# Patient Record
Sex: Male | Born: 1985 | Race: White | Hispanic: No | Marital: Married | Smoking: Never smoker
Health system: Southern US, Community
[De-identification: ages and names within clinical notes are randomized; demographics above are authoritative.]

## PROBLEM LIST (undated history)

## (undated) DIAGNOSIS — T7840XA Allergy, unspecified, initial encounter: Secondary | ICD-10-CM

## (undated) DIAGNOSIS — I10 Essential (primary) hypertension: Secondary | ICD-10-CM

## (undated) DIAGNOSIS — K219 Gastro-esophageal reflux disease without esophagitis: Secondary | ICD-10-CM

## (undated) HISTORY — DX: Allergy, unspecified, initial encounter: T78.40XA

## (undated) HISTORY — DX: Essential (primary) hypertension: I10

## (undated) HISTORY — PX: INNER EAR SURGERY: SHX679

## (undated) HISTORY — PX: EYE SURGERY: SHX253

## (undated) HISTORY — DX: Gastro-esophageal reflux disease without esophagitis: K21.9

---

## 2014-04-22 ENCOUNTER — Emergency Department: Payer: Self-pay | Admitting: Emergency Medicine

## 2015-06-19 ENCOUNTER — Encounter: Payer: Self-pay | Admitting: Emergency Medicine

## 2015-06-19 ENCOUNTER — Emergency Department
Admission: EM | Admit: 2015-06-19 | Discharge: 2015-06-20 | Disposition: A | Discharge status: Home / Self Care | Payer: BLUE CROSS/BLUE SHIELD | Attending: Emergency Medicine | Admitting: Emergency Medicine

## 2015-06-19 ENCOUNTER — Emergency Department: Payer: BLUE CROSS/BLUE SHIELD

## 2015-06-19 DIAGNOSIS — S29019A Strain of muscle and tendon of unspecified wall of thorax, initial encounter: Secondary | ICD-10-CM

## 2015-06-19 DIAGNOSIS — Y92410 Unspecified street and highway as the place of occurrence of the external cause: Secondary | ICD-10-CM | POA: Insufficient documentation

## 2015-06-19 DIAGNOSIS — S299XXA Unspecified injury of thorax, initial encounter: Secondary | ICD-10-CM | POA: Diagnosis present

## 2015-06-19 DIAGNOSIS — Y998 Other external cause status: Secondary | ICD-10-CM | POA: Insufficient documentation

## 2015-06-19 DIAGNOSIS — S29012A Strain of muscle and tendon of back wall of thorax, initial encounter: Secondary | ICD-10-CM | POA: Diagnosis not present

## 2015-06-19 DIAGNOSIS — Y9389 Activity, other specified: Secondary | ICD-10-CM | POA: Diagnosis not present

## 2015-06-19 MED ORDER — NAPROXEN 500 MG PO TABS: 500.0000 mg | ORAL_TABLET | Freq: Two times a day (BID) | ORAL | Status: DC

## 2015-06-19 NOTE — Discharge Instructions (Signed)
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
## 2015-06-20 MED ORDER — CYCLOBENZAPRINE HCL 5 MG PO TABS: 5.0000 mg | ORAL_TABLET | Freq: Three times a day (TID) | ORAL | Status: DC | PRN
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
## 2019-08-20 ENCOUNTER — Emergency Department: Payer: BC Managed Care – PPO

## 2019-08-20 ENCOUNTER — Encounter: Payer: Self-pay | Admitting: Emergency Medicine

## 2019-08-20 ENCOUNTER — Emergency Department
Admission: EM | Admit: 2019-08-20 | Discharge: 2019-08-20 | Disposition: A | Discharge status: Home / Self Care | Payer: BC Managed Care – PPO | Attending: Emergency Medicine | Admitting: Emergency Medicine

## 2019-08-20 DIAGNOSIS — M25561 Pain in right knee: Secondary | ICD-10-CM | POA: Insufficient documentation

## 2019-08-20 MED ORDER — MELOXICAM 15 MG PO TABS: 15.0000 mg | ORAL_TABLET | Freq: Every day | ORAL | 1 refills | Status: AC

## 2019-08-20 NOTE — ED Triage Notes (Signed)
Pt reports he was seen at [facility] on 11/10, no xrays, placed in ace wrap and pt worked today and pain and swelling to the right knee started to go down into right foot. Minimal swelling noted to area. Pt denies injury.

## 2019-08-20 NOTE — ED Notes (Signed)
Pt presents with c/o right knee pain that radiates to right foot. Pt ambulatory to ED room in NAD. Pt with steady gait.

## 2019-08-20 NOTE — ED Provider Notes (Signed)
[facility] Emergency Department Provider Note  ____________________________________________  Time seen: Approximately 10:05 PM  I have reviewed the triage vital signs and the nursing notes.   HISTORY  Chief Complaint Knee Pain    HPI Dennis Gibson is a 33 y.o. male presents to the emergency department with acute right knee pain for the past 2 days.  Patient states that he was seen and evaluated at a local urgent care on 11/10 and was treated conservatively with ibuprofen.  Patient states that he has been diagnosed with psoriasis in the past but has never been diagnosed with psoriatic arthritis.  He denies falls or mechanisms of trauma.  He has been able to ambulate without difficulty.        No past medical history on file.  There are no active problems to display for this patient.   No past surgical history on file.  Prior to Admission medications   Medication Sig Start Date End Date Taking? Authorizing Provider  cyclobenzaprine (FLEXERIL) 5 MG tablet Take 1 tablet (5 mg total) by mouth every 8 (eight) hours as needed for muscle spasms. 06/20/15   Paulette Blanch, MD  meloxicam (MOBIC) 15 MG tablet Take 1 tablet (15 mg total) by mouth daily for 7 days. 08/20/19 08/27/19  Lannie Fields, PA-C  naproxen (NAPROSYN) 500 MG tablet Take 1 tablet (500 mg total) by mouth 2 (two) times daily with a meal. 06/19/15   Carrie Mew, MD    Allergies Patient has no known allergies.  No family history on file.  Social History Social History   Tobacco Use  . Smoking status: Never Smoker  . Smokeless tobacco: Never Used  Substance Use Topics  . Alcohol use: Not on file  . Drug use: Not on file     Review of Systems  Constitutional: No fever/chills Eyes: No visual changes. No discharge ENT: No upper respiratory complaints. Cardiovascular: no chest pain. Respiratory: no cough. No SOB. Gastrointestinal: No abdominal pain.  No nausea, no vomiting.  No  diarrhea.  No constipation. Musculoskeletal: Patient has right knee pain.  Skin: Patient has rash.  Neurological: Negative for headaches, focal weakness or numbness.   ____________________________________________   PHYSICAL EXAM:  VITAL SIGNS: ED Triage Vitals  Enc Vitals Group     BP 08/20/19 2008 (!) 139/95     Pulse Rate 08/20/19 2008 79     Resp 08/20/19 2008 18     Temp 08/20/19 2008 98.3 F (36.8 C)     Temp Source 08/20/19 2008 Oral     SpO2 08/20/19 2008 97 %     Weight --      Height --      Head Circumference --      Peak Flow --      Pain Score 08/20/19 2200 0     Pain Loc --      Pain Edu? --      Excl. in [place]? --      Constitutional: Alert and oriented. Well appearing and in no acute distress. Eyes: Conjunctivae are normal. PERRL. EOMI. Head: Atraumatic. ENT:      Nose: No congestion/rhinnorhea.      Mouth/Throat: Mucous membranes are moist.  Neck: No stridor.  No cervical spine tenderness to palpation. Cardiovascular: Normal rate, regular rhythm. Normal S1 and S2.  Good peripheral circulation. Respiratory: Normal respiratory effort without tachypnea or retractions. Lungs CTAB. Good air entry to the bases with no decreased or absent breath sounds. Gastrointestinal:  Bowel sounds 4 quadrants. Soft and nontender to palpation. No guarding or rigidity. No palpable masses. No distention. No CVA tenderness. Musculoskeletal: No significant effusion of the right knee.  Negative anterior and posterior drawer test.  Negative ballottement.  No laxity with MCL or LCL testing.  Palpable dorsalis pedis pulse, right. Neurologic:  Normal speech and language. No gross focal neurologic deficits are appreciated.  Skin: Patient has psoriasis of the knees and elbows. Psychiatric: Mood and affect are normal. Speech and behavior are normal. Patient exhibits appropriate insight and judgement.   ____________________________________________   LABS (all labs ordered are listed,  but only abnormal results are displayed)  Labs Reviewed - No data to display ____________________________________________  EKG   ____________________________________________  RADIOLOGY I personally viewed and evaluated these images as part of my medical decision making, as well as reviewing the written report by the radiologist.  Dg Knee 2 Views Right  Result Date: 08/20/2019 CLINICAL DATA:  Right knee pain EXAM: RIGHT KNEE - 1-2 VIEW COMPARISON:  None. FINDINGS: No evidence of fracture, or dislocation. There is a small knee joint effusion. Tiny well corticated ossicle seen within the lateral joint space which is nonspecific. No evidence of arthropathy or other focal bone abnormality. Soft tissues are unremarkable. IMPRESSION: No acute osseous abnormality. 1. Small knee joint effusion. Electronically Signed   By: Jonna Clark M.D.   On: 08/20/2019 21:19    ____________________________________________    PROCEDURES  Procedure(s) performed:    Procedures    Medications - No data to display   ____________________________________________   INITIAL IMPRESSION / ASSESSMENT AND PLAN / ED COURSE  Pertinent labs & imaging results that were available during my care of the patient were reviewed by me and considered in my medical decision making (see chart for details).  Review of the [place] CSRS was performed in accordance of the [facility] prior to dispensing any controlled drugs.           Assessment and plan Psoriatic arthritis 33 year old male presents to the emergency department with acute right knee pain for the past 2 days.  Patient was hypertensive at triage but vital signs were otherwise reassuring.  Patient had evidence of psoriasis over her right knee and right and left elbow.  I am suspicious for psoriatic arthritis.  No bony abnormality was visualized on x-ray examination of the right knee.  There was no significant edema of the right knee or right calf.  No overlying  erythema.  Patient was discharged with meloxicam and advised to follow-up with primary care as needed.  All patient questions were answered.   ____________________________________________  FINAL CLINICAL IMPRESSION(S) / ED DIAGNOSES  Final diagnoses:  Acute pain of right knee      NEW MEDICATIONS STARTED DURING THIS VISIT:  ED Discharge Orders         Ordered    meloxicam (MOBIC) 15 MG tablet  Daily     08/20/19 2152              This chart was dictated using voice recognition software/Dragon. Despite best efforts to proofread, errors can occur which can change the meaning. Any change was purely unintentional.    Orvil Feil, PA-C 08/20/19 2215    Minna Antis, MD 08/20/19 2308

## 2020-06-22 ENCOUNTER — Ambulatory Visit: Payer: Self-pay | Admitting: Family Medicine

## 2021-09-18 ENCOUNTER — Ambulatory Visit
Admission: EM | Admit: 2021-09-18 | Discharge: 2021-09-18 | Disposition: A | Discharge status: Home / Self Care | Payer: No Typology Code available for payment source | Attending: Physician Assistant | Admitting: Physician Assistant

## 2021-09-18 ENCOUNTER — Other Ambulatory Visit: Payer: Self-pay

## 2021-09-18 ENCOUNTER — Encounter: Payer: Self-pay | Admitting: Emergency Medicine

## 2021-09-18 DIAGNOSIS — Z20822 Contact with and (suspected) exposure to covid-19: Secondary | ICD-10-CM | POA: Insufficient documentation

## 2021-09-18 DIAGNOSIS — R051 Acute cough: Secondary | ICD-10-CM | POA: Diagnosis not present

## 2021-09-18 DIAGNOSIS — H9203 Otalgia, bilateral: Secondary | ICD-10-CM | POA: Insufficient documentation

## 2021-09-18 DIAGNOSIS — J029 Acute pharyngitis, unspecified: Secondary | ICD-10-CM | POA: Diagnosis not present

## 2021-09-18 DIAGNOSIS — J069 Acute upper respiratory infection, unspecified: Secondary | ICD-10-CM | POA: Diagnosis not present

## 2021-09-18 LAB — GROUP A STREP BY PCR: Group A Strep by PCR: NOT DETECTED

## 2021-09-18 LAB — RESP PANEL BY RT-PCR (FLU A&B, COVID) ARPGX2
Influenza A by PCR: NEGATIVE
Influenza B by PCR: NEGATIVE
SARS Coronavirus 2 by RT PCR: NEGATIVE

## 2021-09-18 MED ORDER — LIDOCAINE VISCOUS HCL 2 % MT SOLN: 15.0000 mL | OROMUCOSAL | 0 refills | Status: DC | PRN

## 2021-09-18 MED ORDER — PSEUDOEPH-BROMPHEN-DM 30-2-10 MG/5ML PO SYRP: 10.0000 mL | ORAL_SOLUTION | Freq: Four times a day (QID) | ORAL | 0 refills | Status: AC | PRN

## 2021-09-18 NOTE — Discharge Instructions (Addendum)
-  Negative strep, COVID and flu. - I have sent medication to help with your symptoms. - Use over-the-counter Debrox solution to help soften your earwax.  Perhaps removing some of the earwax may help with your ear pain.  URI/COLD SYMPTOMS: Your exam today is consistent with a viral illness. Antibiotics are not indicated at this time. Use medications as directed, including cough syrup, nasal saline, and decongestants. Your symptoms should improve over the next few days and resolve within 7-10 days. Increase rest and fluids. F/u if symptoms worsen or predominate such as sore throat, ear pain, productive cough, shortness of breath, or if you develop high fevers or worsening fatigue over the next several days.

## 2021-09-18 NOTE — ED Provider Notes (Signed)
[facility]    CSN: [id number] Arrival date & time: 09/18/21  0801      History   Chief Complaint Chief Complaint  Patient presents with   Sore Throat   Otalgia   Cough    HPI Dennis Gibson is a 35 y.o. male presenting for 2-day history of cough, congestion, sore throat and bilateral ear pain.  Patient reports that his throat hurts the most and he believes he may have strep throat or possibly ear infection.  He has not recorded any fevers or had chills or sweats.  Denies body aches, chest pain, breathing difficulty, vomiting or diarrhea.  Has been taking over-the-counter decongestants.  No sick contacts or known exposure to COVID or flu.  No other complaints.  HPI  History reviewed. No pertinent past medical history.  There are no problems to display for this patient.   Past Surgical History:  Procedure Laterality Date   EYE SURGERY     INNER EAR SURGERY Right        Home Medications    Prior to Admission medications   Medication Sig Start Date End Date Taking? Authorizing Provider  brompheniramine-pseudoephedrine-DM 30-2-10 MG/5ML syrup Take 10 mLs by mouth 4 (four) times daily as needed for up to 7 days. 09/18/21 09/25/21 Yes Shirlee Latch, PA-C  famotidine (PEPCID) 20 MG tablet Take 20 mg by mouth 2 (two) times daily.   Yes [provider]  lidocaine (XYLOCAINE) 2 % solution Use as directed 15 mLs in the mouth or throat every 3 (three) hours as needed for mouth pain (swish and spit). 09/18/21  Yes Shirlee Latch PA-C    Family History History reviewed. No pertinent family history.  Social History Social History   Tobacco Use   Smoking status: Never   Smokeless tobacco: Never  Vaping Use   Vaping Use: Never used  Substance Use Topics   Alcohol use: Never     Allergies   Patient has no known allergies.   Review of Systems Review of Systems  Constitutional:  Negative for fatigue and fever.  HENT:  Positive for congestion,  ear pain, rhinorrhea and sore throat. Negative for sinus pressure and sinus pain.   Respiratory:  Positive for cough. Negative for shortness of breath.   Gastrointestinal:  Negative for abdominal pain, diarrhea, nausea and vomiting.  Musculoskeletal:  Negative for myalgias.  Neurological:  Negative for weakness, light-headedness and headaches.  Hematological:  Negative for adenopathy.    Physical Exam Triage Vital Signs ED Triage Vitals  Enc Vitals Group     BP      Pulse      Resp      Temp      Temp src      SpO2      Weight      Height      Head Circumference      Peak Flow      Pain Score      Pain Loc      Pain Edu?      Excl. in GC?    No data found.  Updated Vital Signs BP (!) 159/110 (BP Location: Left Arm)   Pulse 98   Temp 98.8 F (37.1 C) (Oral)   Resp 16   Ht 5\' 8"  (1.727 m)   Wt 215 lb (97.5 kg)   SpO2 97%   BMI 32.69 kg/m      Physical Exam Vitals and nursing note reviewed.  Constitutional:  General: He is not in acute distress.    Appearance: Normal appearance. He is well-developed. He is ill-appearing.  HENT:     Head: Normocephalic and atraumatic.     Right Ear: Tympanic membrane normal.     Left Ear: Tympanic membrane normal.     Ears:     Comments: Moderate amount of cerumen obscuring about 50% of TMs bilaterally    Nose: Congestion present.     Mouth/Throat:     Mouth: Mucous membranes are moist.     Pharynx: Oropharynx is clear. Posterior oropharyngeal erythema present.  Eyes:     General: No scleral icterus.    Conjunctiva/sclera: Conjunctivae normal.  Cardiovascular:     Rate and Rhythm: Normal rate and regular rhythm.     Heart sounds: Normal heart sounds.  Pulmonary:     Effort: Pulmonary effort is normal. No respiratory distress.     Breath sounds: Normal breath sounds.  Musculoskeletal:     Cervical back: Neck supple.  Skin:    General: Skin is warm and dry.     Capillary Refill: Capillary refill takes less than 2  seconds.  Neurological:     General: No focal deficit present.     Mental Status: He is alert. Mental status is at baseline.     Motor: No weakness.     Coordination: Coordination normal.     Gait: Gait normal.  Psychiatric:        Mood and Affect: Mood normal.        Behavior: Behavior normal.        Thought Content: Thought content normal.     UC Treatments / Results  Labs (all labs ordered are listed, but only abnormal results are displayed) Labs Reviewed  GROUP A STREP BY PCR  RESP PANEL BY RT-PCR (FLU A&B, COVID) [id number]    EKG   Radiology No results found.  Procedures Procedures (including critical care time)  Medications Ordered in UC Medications - No data to display  Initial Impression / Assessment and Plan / UC Course  I have reviewed the triage vital signs and the nursing notes.  Pertinent labs & imaging results that were available during my care of the patient were reviewed by me and considered in my medical decision making (see chart for details).  35 year old male presenting for 2-day history of cough, congestion and sore throat.  Also reports that his ears hurt.  BP elevated 159/110.  He is afebrile.  He is mildly ill-appearing, appears fatigued.  Nontoxic.  On exam he has nasal congestion and mild posterior pharyngeal erythema.  He does have cerumen of bilateral EACs obstructing about 50% of TMs but the TM portion I can see are neutral, nonerythematous and nonbulging.  Chest is clear to auscultation.  PCR strep obtained and negative. Respiratory panel obtained and negative.  Reviewed results of labs with patient.  Advised him that he has another viral illness.  Suspect he will feel unwell for about 7 to 10 days.  Advised supportive care and symptomatic treatment.  Treating with Bromfed-DM and viscous lidocaine.  Advised to increase rest and fluids.  Work note given for the next couple of days.  Reviewed return and ER precautions.  Final Clinical  Impressions(s) / UC Diagnoses   Final diagnoses:  Viral upper respiratory tract infection  Acute cough  Sore throat  Otalgia of both ears     Discharge Instructions      -Negative strep, COVID and flu. - I have  sent medication to help with your symptoms. - Use over-the-counter Debrox solution to help soften your earwax.  Perhaps removing some of the earwax may help with your ear pain.  URI/COLD SYMPTOMS: Your exam today is consistent with a viral illness. Antibiotics are not indicated at this time. Use medications as directed, including cough syrup, nasal saline, and decongestants. Your symptoms should improve over the next few days and resolve within 7-10 days. Increase rest and fluids. F/u if symptoms worsen or predominate such as sore throat, ear pain, productive cough, shortness of breath, or if you develop high fevers or worsening fatigue over the next several days.       ED Prescriptions     Medication Sig Dispense Auth. Provider   brompheniramine-pseudoephedrine-DM 30-2-10 MG/5ML syrup Take 10 mLs by mouth 4 (four) times daily as needed for up to 7 days. 150 mL Eusebio Friendly B, PA-C   lidocaine (XYLOCAINE) 2 % solution Use as directed 15 mLs in the mouth or throat every 3 (three) hours as needed for mouth pain (swish and spit). 100 mL Shirlee Latch, PA-C      PDMP not reviewed this encounter.   Shirlee Latch, PA-C 09/18/21 [phone]

## 2021-09-18 NOTE — ED Triage Notes (Signed)
Patient c/o cough, sore throat, bilateral ear pain that started on Friday.  Patient unsure of fevers.

## 2022-06-20 ENCOUNTER — Emergency Department
Admission: EM | Admit: 2022-06-20 | Discharge: 2022-06-20 | Disposition: A | Discharge status: Home / Self Care | Payer: No Typology Code available for payment source | Attending: Emergency Medicine | Admitting: Emergency Medicine

## 2022-06-20 ENCOUNTER — Other Ambulatory Visit: Payer: Self-pay

## 2022-06-20 ENCOUNTER — Emergency Department: Payer: No Typology Code available for payment source

## 2022-06-20 ENCOUNTER — Encounter: Payer: Self-pay | Admitting: Emergency Medicine

## 2022-06-20 DIAGNOSIS — R030 Elevated blood-pressure reading, without diagnosis of hypertension: Secondary | ICD-10-CM | POA: Diagnosis not present

## 2022-06-20 DIAGNOSIS — M545 Low back pain, unspecified: Secondary | ICD-10-CM | POA: Diagnosis present

## 2022-06-20 MED ORDER — METHOCARBAMOL 500 MG PO TABS
1000.0000 mg | ORAL_TABLET | Freq: Once | ORAL | Status: AC
  Administered 2022-06-20: 1000 mg via ORAL
  Filled 2022-06-20: qty 2

## 2022-06-20 MED ORDER — NAPROXEN 500 MG PO TABS
500.0000 mg | ORAL_TABLET | Freq: Two times a day (BID) | ORAL | 0 refills | Status: DC
  Filled 2022-06-20: qty 20, 10d supply, fill #0

## 2022-06-20 MED ORDER — METHOCARBAMOL 500 MG PO TABS
ORAL_TABLET | ORAL | 0 refills | Status: DC
  Filled 2022-06-20: qty 20, 3d supply, fill #0

## 2022-06-20 MED ORDER — OXYCODONE-ACETAMINOPHEN 7.5-325 MG PO TABS
1.0000 | ORAL_TABLET | Freq: Once | ORAL | Status: DC
  Filled 2022-06-20: qty 1

## 2022-06-20 MED ORDER — KETOROLAC TROMETHAMINE 30 MG/ML IJ SOLN
30.0000 mg | Freq: Once | INTRAMUSCULAR | Status: AC
  Administered 2022-06-20: 30 mg via INTRAMUSCULAR
  Filled 2022-06-20: qty 1

## 2022-06-20 NOTE — Discharge Instructions (Addendum)
Follow-up with [place] urgent care or doctor of your choice if any continued problems.  Begin taking methocarbamol 1 or 2 tablets every 6 hours as needed for muscle spasms and naproxen 500 mg twice a day with food.  You may also use ice or heat to your back as needed for discomfort.  The over-the-counter Lidoderm patches may also give you some relief.  You should plan on having your blood pressure rechecked after your back is better as your blood pressure may be elevated just due to pain however in the emergency department your blood pressure was elevated initial blood pressure was 157/104 and the second time it was checked was 157/114.

## 2022-06-20 NOTE — ED Notes (Signed)
36 yom with a c/c of lower back pain since Saturday. The pt denies any injury or previous back problems.

## 2022-06-20 NOTE — ED Provider Notes (Signed)
[facility] Provider Note    Event Date/Time   First MD Initiated Contact with Patient 06/20/22 1032     (approximate)   History   Back Pain   HPI  Dennis Gibson is a 36 y.o. male   to the ED with complaint of left lower back pain that started Saturday when he woke up.  Patient does not recall any specific injury.  He states that lying flat puts too much pressure on his back and he is unable to tolerate that.  He has been sleeping in a recliner to relieve some of the pain.  He has been using topical creams to the area with some minimal relief.  He denies any urinary symptoms, incontinence of bowel or bladder.  Patient continues to be ambulatory.  No prior history of back issues.      Physical Exam   Triage Vital Signs: ED Triage Vitals  Enc Vitals Group     BP 06/20/22 1009 (!) 157/104     Pulse Rate 06/20/22 1009 85     Resp 06/20/22 1009 18     Temp 06/20/22 1009 98.3 F (36.8 C)     Temp Source 06/20/22 1009 Oral     SpO2 06/20/22 1009 95 %     Weight 06/20/22 1010 214 lb 15.2 oz (97.5 kg)     Height 06/20/22 1010 5\' 8"  (1.727 m)     Head Circumference --      Peak Flow --      Pain Score 06/20/22 1009 6     Pain Loc --      Pain Edu? --      Excl. in GC? --     Most recent vital signs: Vitals:   06/20/22 1220 06/20/22 1235  BP: (!) 157/114 (!) 160/100  Pulse:  87  Resp:  16  Temp:    SpO2:  99%     General: Awake, no distress.  CV:  Good peripheral perfusion.  Heart regular rate and rhythm. Resp:  Normal effort.  Lungs are clear bilaterally. Abd:  No distention.  Other:  No point bony tenderness on palpation of the thoracic or lumbar spine.  There is tenderness on palpation of the left paravertebral muscles and range of motion is markedly guarded secondary to increased pain.   ED Results / Procedures / Treatments   Labs (all labs ordered are listed, but only abnormal results are displayed) Labs Reviewed - No data to  display   RADIOLOGY Spine x-ray images were reviewed by myself independent of the radiologist and no acute fractures noted.  Radiology report officially reads negative.    PROCEDURES:  Critical Care performed:   Procedures   MEDICATIONS ORDERED IN ED: Medications  oxyCODONE-acetaminophen (PERCOCET) 7.5-325 MG per tablet 1 tablet (1 tablet Oral Not Given 06/20/22 1105)  methocarbamol (ROBAXIN) tablet 1,000 mg (1,000 mg Oral Given 06/20/22 1105)  ketorolac (TORADOL) 30 MG/ML injection 30 mg (30 mg Intramuscular Given 06/20/22 1105)     IMPRESSION / MDM / ASSESSMENT AND PLAN / ED COURSE  I reviewed the triage vital signs and the nursing notes.   Differential diagnosis includes, but is not limited to, low back pain, low back strain, degenerative disc disease, cauda equina.  36 year old male presents to the ED with complaint of low back pain without history of injury.  Patient states that he has been unable to move easily without severe pain has been sleeping in a recliner due to inability  to lie flat.  Patient denies any radiation to his lower extremities.  X-rays are reassuring and patient was made aware.  Prior to discharge patient was feeling much better after being given Toradol 30 mg IM and methocarbamol 1000 mg p.o.  Patient is to continue with warm moist compresses or ice to his back and a prescription for methocarbamol was sent to the pharmacy along with naproxen.  He was made aware that the methocarbamol could cause drowsiness and should not drive or operate machinery while taking that medication.      Patient's presentation is most consistent with acute complicated illness / injury requiring diagnostic workup.  FINAL CLINICAL IMPRESSION(S) / ED DIAGNOSES   Final diagnoses:  Acute left-sided low back pain without sciatica  Elevated blood pressure reading     Rx / DC Orders   ED Discharge Orders          Ordered    methocarbamol (ROBAXIN) 500 MG tablet         06/20/22 1224    naproxen (NAPROSYN) 500 MG tablet  2 times daily with meals        06/20/22 1224             Note:  This document was prepared using Dragon voice recognition software and may include unintentional dictation errors.   Tommi Rumps, PA-C 06/20/22 1302    Merwyn Katos, MD 06/20/22 [phone]

## 2022-06-20 NOTE — ED Triage Notes (Signed)
Pt here c/o LL back pain. Pt states he cannot put pressure or lay flat due to the pain. Pt states he sleeps in a recliner to relieve the pain.

## 2023-02-14 ENCOUNTER — Other Ambulatory Visit: Payer: Self-pay

## 2023-02-14 ENCOUNTER — Encounter: Payer: Self-pay | Admitting: Emergency Medicine

## 2023-02-14 ENCOUNTER — Ambulatory Visit
Admission: EM | Admit: 2023-02-14 | Discharge: 2023-02-14 | Disposition: A | Discharge status: Home / Self Care | Payer: 59 | Attending: Emergency Medicine | Admitting: Emergency Medicine

## 2023-02-14 DIAGNOSIS — T7840XA Allergy, unspecified, initial encounter: Secondary | ICD-10-CM

## 2023-02-14 DIAGNOSIS — I10 Essential (primary) hypertension: Secondary | ICD-10-CM

## 2023-02-14 DIAGNOSIS — T63441A Toxic effect of venom of bees, accidental (unintentional), initial encounter: Secondary | ICD-10-CM

## 2023-02-14 DIAGNOSIS — R739 Hyperglycemia, unspecified: Secondary | ICD-10-CM | POA: Diagnosis not present

## 2023-02-14 LAB — BASIC METABOLIC PANEL
Anion gap: 9 (ref 5–15)
BUN: 16 mg/dL (ref 6–20)
CO2: 23 mmol/L (ref 22–32)
Calcium: 9.8 mg/dL (ref 8.9–10.3)
Chloride: 101 mmol/L (ref 98–111)
Creatinine, Ser: 0.9 mg/dL (ref 0.61–1.24)
GFR, Estimated: >60 mL/min (ref >60)
Glucose, Bld: 247 mg/dL — ABNORMAL HIGH (ref 70–99)
Potassium: 3.7 mmol/L (ref 3.5–5.1)
Sodium: 133 mmol/L — ABNORMAL LOW (ref 135–145)

## 2023-02-14 MED ORDER — DEXAMETHASONE SODIUM PHOSPHATE 10 MG/ML IJ SOLN
10.0000 mg | Freq: Once | INTRAMUSCULAR | Status: AC
  Administered 2023-02-14: 10 mg via INTRAMUSCULAR

## 2023-02-14 MED ORDER — PREDNISONE 10 MG PO TABS
ORAL_TABLET | ORAL | 0 refills | Status: AC
  Filled 2023-02-14: qty 21, 6d supply, fill #0

## 2023-02-14 MED ORDER — DIPHENHYDRAMINE HCL 50 MG PO CAPS
50.0000 mg | ORAL_CAPSULE | Freq: Once | ORAL | Status: AC
  Administered 2023-02-14: 50 mg via ORAL

## 2023-02-14 MED ORDER — LISINOPRIL 10 MG PO TABS
10.0000 mg | ORAL_TABLET | Freq: Every day | ORAL | 2 refills | Status: DC
  Filled 2023-02-14: qty 30, 30d supply, fill #0

## 2023-02-14 MED ORDER — FAMOTIDINE 20 MG PO TABS
20.0000 mg | ORAL_TABLET | Freq: Once | ORAL | Status: AC
  Administered 2023-02-14: 20 mg via ORAL

## 2023-02-14 NOTE — ED Triage Notes (Addendum)
Pt presents to UC for concerns allergic reaction, pt states he was stung by bee around 11:50am below RT pinky, pt denies any trouble breathing no trouble swallowing. Pt states last time this happened he was stung behind his ear and took about an hour for throat to start itching getting tight.

## 2023-02-14 NOTE — Discharge Instructions (Addendum)
Take over-the-counter Allegra 180 mg daily or Zyrtec or Claritin 10 mg daily to help with your itching.  You can take over-the-counter Benadryl, 50 mg at bedtime, as needed for itching and sleep.  Take the prednisone pack according to the package instructions.  You will taken on tapering dose over a period of 6 days.  Take it with food and always take it first in the morning with breakfast.  Take over-the-counter Pepcid 20 mg twice daily to help with itching as well.  If you develop any swelling of your lips or tongue, tightness in your throat, or difficulty breathing you need to go to the ER for evaluation.   For your blood pressure I am going to start you on lisinopril 10 mg daily.  Take this every morning.  I want you to obtain a blood pressure cuff and check your blood pressure periodically between now and the time that you are seen by primary care.  Do not take the readings for Singh in the morning or late at night.  Make sure that you have rested for 30 minutes prior to taking her blood pressure, your bladder is empty, and the arm you are going to take the reading and has been at heart level for at least 5 minutes.  Your blood sugar is also elevated which could mean that you have developed diabetes as we discussed.  You have a family history of diabetes.  I would avoid any concentrated sweets and limit your carbohydrate intake.  Lean proteins, vegetables, and fiber will help better control your blood sugar.  We have established a PCP visit for you at discharge.  Please keep this appointment to discuss your ongoing medical issues.

## 2023-02-14 NOTE — ED Provider Notes (Signed)
[facility]    CSN: [id number] Arrival date & time: 02/14/23  1238      History   Chief Complaint Chief Complaint  Patient presents with   Allergic Reaction   Insect Bite    Bee Sting     HPI Dennis Gibson is a 37 y.o. male.   HPI  37 year old male with a past medical history of allergy to bee stings presents for evaluation following a bee sting approximately 1 hour ago.  He denies any tightness in his throat, itching in his throat, or shortness of breath.  He has some localized tenderness at the area of envenomation which was near his right pinky.  He reports that last time he was stung it took approximately an hour before respiratory symptoms developed.  He was prescribed an EpiPen but he has not taken it.  Patient is mildly tachycardic with a heart rate of 103 and hypertensive in clinic but no respiratory distress.  History reviewed. No pertinent past medical history.  There are no problems to display for this patient.   Past Surgical History:  Procedure Laterality Date   EYE SURGERY     INNER EAR SURGERY Right        Home Medications    Prior to Admission medications   Medication Sig Start Date End Date Taking? Authorizing Provider  lisinopril (ZESTRIL) 10 MG tablet Take 1 tablet (10 mg total) by mouth daily. 02/14/23  Yes Becky Augusta, NP  predniSONE (STERAPRED UNI-PAK 21 TAB) 10 MG (21) TBPK tablet Take 6 tablets on day 1, 5 tablets day 2, 4 tablets day 3, 3 tablets day 4, 2 tablets day 5, 1 tablet day 6 02/14/23  Yes Becky Augusta, NP    Family History History reviewed. No pertinent family history.  Social History Social History   Tobacco Use   Smoking status: Never   Smokeless tobacco: Never  Vaping Use   Vaping Use: Never used  Substance Use Topics   Alcohol use: Never   Drug use: Never     Allergies   Patient has no known allergies.   Review of Systems Review of Systems  Respiratory:  Negative for shortness of breath, wheezing and  stridor.   Skin:  Positive for color change.     Physical Exam Triage Vital Signs ED Triage Vitals  Enc Vitals Group     BP      Pulse      Resp      Temp      Temp src      SpO2      Weight      Height      Head Circumference      Peak Flow      Pain Score      Pain Loc      Pain Edu?      Excl. in GC?    No data found.  Updated Vital Signs BP (!) 157/121 (BP Location: Right Arm)   Pulse 97   Temp 98.6 F (37 C) (Oral)   Ht 5\' 8"  (1.727 m)   Wt 220 lb (99.8 kg)   SpO2 97%   BMI 33.45 kg/m   Visual Acuity Right Eye Distance:   Left Eye Distance:   Bilateral Distance:    Right Eye Near:   Left Eye Near:    Bilateral Near:     Physical Exam Vitals reviewed.  Constitutional:      Appearance: Normal appearance. He is  not ill-appearing.  HENT:     Head: Normocephalic and atraumatic.  Cardiovascular:     Rate and Rhythm: Regular rhythm. Tachycardia present.     Pulses: Normal pulses.     Heart sounds: Normal heart sounds. No murmur heard.    No friction rub. No gallop.  Pulmonary:     Effort: Pulmonary effort is normal.     Breath sounds: Normal breath sounds. No stridor. No wheezing, rhonchi or rales.  Skin:    General: Skin is warm and dry.     Capillary Refill: Capillary refill takes less than 2 seconds.     Findings: Erythema present.  Neurological:     General: No focal deficit present.     Mental Status: He is alert and oriented to person, place, and time.      UC Treatments / Results  Labs (all labs ordered are listed, but only abnormal results are displayed) Labs Reviewed  BASIC METABOLIC PANEL - Abnormal; Notable for the following components:      Result Value   Sodium 133 (*)    Glucose, Bld 247 (*)    All other components within normal limits    EKG   Radiology No results found.  Procedures Procedures (including critical care time)  Medications Ordered in UC Medications  diphenhydrAMINE (BENADRYL) capsule 50 mg (50 mg  Oral Given 02/14/23 1302)  famotidine (PEPCID) tablet 20 mg (20 mg Oral Given 02/14/23 1303)  dexamethasone (DECADRON) injection 10 mg (10 mg Intramuscular Given 02/14/23 1301)    Initial Impression / Assessment and Plan / UC Course  I have reviewed the triage vital signs and the nursing notes.  Pertinent labs & imaging results that were available during my care of the patient were reviewed by me and considered in my medical decision making (see chart for details).   Is a pleasant, nontoxic-appearing 37 year old male presenting for evaluation following the envenomation in the setting of being allergic to bee stings.  On exam he is not in any acute distress at this time and he can speak in full sentence without dyspnea or tachypnea.  No stridor appreciated when auscultating over the trachea and his lungs are clear to auscultation all fields.  As you can see in image above there is some mild erythema at the site of envenomation but no hive formation or significant swelling.  I will order 10 mg of IM Decadron to be given in clinic along with 50 mg of p.o. Benadryl and 20 mg of p.o. Pepcid.  The plan is to observe the patient.  I will also recheck patient's vital signs as patient's blood pressure was 186/132.  If patient's blood pressure does not come down I will refer him to the ER for evaluation.  If patient's vital signs normalize and he does not develop any respiratory compromise I will discharge him home with prednisone and antihistamine therapy.  If the patient does develop respiratory compromise he will receive epinephrine and be transferred to the emergency department by EMS.  The patient reports that he is feeling tired from the Benadryl but he denies any swelling of his tongue, itchiness or tightness in his throat, or shortness of breath.  He is able to speak in full sentence without dyspnea or tachypnea.  He is laying on his abdomen on the stretcher looking at his phone.  I will request staff to  recheck patient's blood pressure to ensure that it is coming down.  Patient's blood pressure remains elevated.  When reviewing  the historical trends patient has elevated blood pressures going back the last 3 years.  I discussed the findings with the patient.  He reports that his father had high blood pressure and he thinks his mom also does.  I will order a BMP to evaluate patient's renal function with the plan to discharge him home on antihypertensive therapy and have staff set him up with a primary care visit at time of discharge.  BMP shows mild hyponatremia with a sodium of 133.  Glucose is elevated to 247.  Patient has not eaten since breakfast time this morning.  His father did have diabetes.  I will discharge patient on the diagnosis of allergic reaction to bee sting, hypertension, and diabetes.  I discussed starting him on metformin and he says he will take it because his dad could not tolerate it and his wife is having issues as well.  I will hold off on starting him on medication for his diabetes but we will start him on lisinopril 10 mg daily for control of his blood pressure.  I will also have staff establish a primary care visit for the patient prior to discharge.  Final Clinical Impressions(s) / UC Diagnoses   Final diagnoses:  Allergic reaction, initial encounter  Bee sting, accidental or unintentional, initial encounter  Essential hypertension  Hyperglycemia     Discharge Instructions      Take over-the-counter Allegra 180 mg daily or Zyrtec or Claritin 10 mg daily to help with your itching.  You can take over-the-counter Benadryl, 50 mg at bedtime, as needed for itching and sleep.  Take the prednisone pack according to the package instructions.  You will taken on tapering dose over a period of 6 days.  Take it with food and always take it first in the morning with breakfast.  Take over-the-counter Pepcid 20 mg twice daily to help with itching as well.  If you develop  any swelling of your lips or tongue, tightness in your throat, or difficulty breathing you need to go to the ER for evaluation.   For your blood pressure I am going to start you on lisinopril 10 mg daily.  Take this every morning.  I want you to obtain a blood pressure cuff and check your blood pressure periodically between now and the time that you are seen by primary care.  Do not take the readings for Singh in the morning or late at night.  Make sure that you have rested for 30 minutes prior to taking her blood pressure, your bladder is empty, and the arm you are going to take the reading and has been at heart level for at least 5 minutes.  Your blood sugar is also elevated which could mean that you have developed diabetes as we discussed.  You have a family history of diabetes.  I would avoid any concentrated sweets and limit your carbohydrate intake.  Lean proteins, vegetables, and fiber will help better control your blood sugar.  We have established a PCP visit for you at discharge.  Please keep this appointment to discuss your ongoing medical issues.     ED Prescriptions     Medication Sig Dispense Auth. Provider   predniSONE (STERAPRED UNI-PAK 21 TAB) 10 MG (21) TBPK tablet Take 6 tablets on day 1, 5 tablets day 2, 4 tablets day 3, 3 tablets day 4, 2 tablets day 5, 1 tablet day 6 21 tablet Becky Augusta, NP   lisinopril (ZESTRIL) 10 MG tablet Take 1 tablet (  10 mg total) by mouth daily. 30 tablet Becky Augusta, NP      PDMP not reviewed this encounter.   Becky Augusta, NP 02/14/23 1450

## 2023-04-18 ENCOUNTER — Other Ambulatory Visit: Payer: Self-pay

## 2023-04-18 MED ORDER — IBUPROFEN 800 MG PO TABS
800.0000 mg | ORAL_TABLET | Freq: Four times a day (QID) | ORAL | 0 refills | Status: DC | PRN
  Filled 2023-04-18: qty 21, 6d supply, fill #0

## 2023-04-18 MED ORDER — AMOXICILLIN 500 MG PO CAPS
500.0000 mg | ORAL_CAPSULE | Freq: Three times a day (TID) | ORAL | 0 refills | Status: DC
  Filled 2023-04-18: qty 21, 7d supply, fill #0

## 2023-04-18 MED ORDER — TRAMADOL HCL 50 MG PO TABS
50.0000 mg | ORAL_TABLET | Freq: Four times a day (QID) | ORAL | 0 refills | Status: DC | PRN
  Filled 2023-04-18: qty 12, 3d supply, fill #0

## 2023-11-16 ENCOUNTER — Ambulatory Visit
Admission: EM | Admit: 2023-11-16 | Discharge: 2023-11-16 | Disposition: A | Discharge status: Home / Self Care | Payer: 59

## 2023-11-16 ENCOUNTER — Encounter: Payer: Self-pay | Admitting: Emergency Medicine

## 2023-11-16 DIAGNOSIS — J069 Acute upper respiratory infection, unspecified: Secondary | ICD-10-CM | POA: Diagnosis not present

## 2023-11-16 DIAGNOSIS — I169 Hypertensive crisis, unspecified: Secondary | ICD-10-CM | POA: Diagnosis not present

## 2023-11-16 DIAGNOSIS — Z91030 Bee allergy status: Secondary | ICD-10-CM | POA: Diagnosis not present

## 2023-11-16 DIAGNOSIS — J329 Chronic sinusitis, unspecified: Secondary | ICD-10-CM | POA: Diagnosis not present

## 2023-11-16 DIAGNOSIS — J3489 Other specified disorders of nose and nasal sinuses: Secondary | ICD-10-CM | POA: Diagnosis not present

## 2023-11-16 DIAGNOSIS — I10 Essential (primary) hypertension: Secondary | ICD-10-CM | POA: Diagnosis not present

## 2023-11-16 DIAGNOSIS — Z758 Other problems related to medical facilities and other health care: Secondary | ICD-10-CM

## 2023-11-16 DIAGNOSIS — Z1152 Encounter for screening for COVID-19: Secondary | ICD-10-CM | POA: Diagnosis not present

## 2023-11-16 DIAGNOSIS — R059 Cough, unspecified: Secondary | ICD-10-CM | POA: Diagnosis not present

## 2023-11-16 DIAGNOSIS — E1165 Type 2 diabetes mellitus with hyperglycemia: Secondary | ICD-10-CM | POA: Diagnosis not present

## 2023-11-16 NOTE — Discharge Instructions (Signed)
 Go to Er for further evaluation of hypertensive crisis Take coricidin HBP for cold symptom management

## 2023-11-16 NOTE — ED Triage Notes (Signed)
 Patient reports cough and chest congestion for 2 weeks.  Patient reports nasal congestion and sinus/facial pressure and pain that started yesterday. Patient denies fevers.

## 2023-11-16 NOTE — ED Provider Notes (Signed)
 [facility]    CSN: [id number] Arrival date & time: 11/16/23  0844      History   Chief Complaint Chief Complaint  Patient presents with   Nasal Congestion   Sinus Problem    HPI Dennis Gibson is a 38 y.o. male.   38 year old male patient, Dennis Gibson, presents to urgent care for evaluation of dry cough for 2 weeks.  Patient reports nasal congestion and facial pressure that started yesterday.  Patient denies any fever, chills, or change in appetite. Took zyrtec PTA for symptom management. Family member had flu last week.  Patient states he is aware he has high blood pressure, does not take any medication for his high blood pressure, and has never gotten any prescriptions filled for his high blood pressure.  Pt denies smoking,alcohol or drug use  The history is provided by the patient. No language interpreter was used.    History reviewed. No pertinent past medical history.  Patient Active Problem List   Diagnosis Date Noted   Hypertensive crisis 11/16/2023   Viral URI with cough 11/16/2023   Does not have primary care provider 11/16/2023    Past Surgical History:  Procedure Laterality Date   EYE SURGERY     INNER EAR SURGERY Right        Home Medications    Prior to Admission medications   Medication Sig Start Date End Date Taking? Authorizing Provider  amoxicillin  (AMOXIL ) 500 MG capsule Take 1 capsule (500 mg total) by mouth 3 (three) times daily until gone. 04/18/23     ibuprofen  (ADVIL ) 800 MG tablet Take 1 tablet (800 mg total) by mouth every 6 (six) hours as needed for pain. 04/18/23     lisinopril  (ZESTRIL ) 10 MG tablet Take 1 tablet (10 mg total) by mouth daily. 02/14/23   Bernardino Ditch, NP  traMADol  (ULTRAM ) 50 MG tablet Take 1 tablet (50 mg total) by mouth every 6 (six) hours as needed for pain. 04/18/23       Family History History reviewed. No pertinent family history.  Social History Social History   Tobacco Use   Smoking status:  Never   Smokeless tobacco: Never  Vaping Use   Vaping status: Never Used  Substance Use Topics   Alcohol use: Never   Drug use: Never     Allergies   Patient has no known allergies.   Review of Systems Review of Systems  Constitutional:  Negative for fever.  HENT:  Positive for congestion and sinus pressure.   Respiratory:  Positive for cough. Negative for shortness of breath, wheezing and stridor.   Cardiovascular:  Negative for chest pain and palpitations.  Neurological:  Negative for dizziness, weakness and headaches.  All other systems reviewed and are negative.    Physical Exam Triage Vital Signs ED Triage Vitals  Encounter Vitals Group     BP 11/16/23 0939 (!) 177/137     Systolic BP Percentile --      Diastolic BP Percentile --      Pulse Rate 11/16/23 0939 100     Resp 11/16/23 0939 16     Temp 11/16/23 0939 98.8 F (37.1 C)     Temp Source 11/16/23 0939 Oral     SpO2 11/16/23 0939 97 %     Weight 11/16/23 0937 210 lb (95.3 kg)     Height 11/16/23 0937 5' 8 (1.727 m)     Head Circumference --      Peak Flow --  Pain Score 11/16/23 0937 5     Pain Loc --      Pain Education --      Exclude from Growth Chart --    No data found.  Updated Vital Signs BP (!) 189/135 (BP Location: Left Arm)   Pulse 100   Temp 98.8 F (37.1 C) (Oral)   Resp 16   Ht 5' 8 (1.727 m)   Wt 210 lb (95.3 kg)   SpO2 97%   BMI 31.93 kg/m   Visual Acuity Right Eye Distance:   Left Eye Distance:   Bilateral Distance:    Right Eye Near:   Left Eye Near:    Bilateral Near:     Physical Exam Vitals and nursing note reviewed.  Constitutional:      General: He is not in acute distress.    Appearance: He is well-developed and well-groomed.  HENT:     Head: Normocephalic.     Right Ear: Tympanic membrane is retracted.     Left Ear: Tympanic membrane is retracted.     Nose: Mucosal edema and congestion present.     Mouth/Throat:     Lips: Pink.     Mouth: Mucous  membranes are moist.     Pharynx: Oropharynx is clear. Uvula midline.  Eyes:     General: Lids are normal.     Conjunctiva/sclera: Conjunctivae normal.     Pupils: Pupils are equal, round, and reactive to light.  Cardiovascular:     Rate and Rhythm: Normal rate and regular rhythm.     Heart sounds: Normal heart sounds.  Pulmonary:     Effort: Pulmonary effort is normal. No respiratory distress.     Breath sounds: Normal breath sounds and air entry. No decreased breath sounds or wheezing.  Abdominal:     General: There is no distension.     Palpations: Abdomen is soft.  Musculoskeletal:        General: Normal range of motion.     Cervical back: Normal range of motion.  Skin:    General: Skin is warm and dry.     Findings: No rash.  Neurological:     General: No focal deficit present.     Mental Status: He is alert and oriented to person, place, and time.     GCS: GCS eye subscore is 4. GCS verbal subscore is 5. GCS motor subscore is 6.     Cranial Nerves: No cranial nerve deficit.     Sensory: No sensory deficit.  Psychiatric:        Attention and Perception: Attention normal.        Mood and Affect: Mood normal.        Speech: Speech normal.        Behavior: Behavior normal. Behavior is cooperative.      UC Treatments / Results  Labs (all labs ordered are listed, but only abnormal results are displayed) Labs Reviewed - No data to display  EKG   Radiology No results found.  Procedures Procedures (including critical care time)  Medications Ordered in UC Medications - No data to display  Initial Impression / Assessment and Plan / UC Course  I have reviewed the triage vital signs and the nursing notes.  Pertinent labs & imaging results that were available during my care of the patient were reviewed by me and considered in my medical decision making (see chart for details).    Discussed exam findings and plan of care with pt,  referred to ER for further evaluation  of hypertensive crisis, discussed future use of Coricidin HBP for symptom management for colds, pt verbalized understanding to this provider.   Ddx: Hypertensive crisis, viral uri with cough, no PCP, allergies Final Clinical Impressions(s) / UC Diagnoses   Final diagnoses:  Hypertensive crisis  Viral URI with cough  Does not have primary care provider     Discharge Instructions      Go to Er for further evaluation of hypertensive crisis Take coricidin HBP for cold symptom management     ED Prescriptions   None    PDMP not reviewed this encounter.   Aminta Loose, NP 11/16/23 1007

## 2023-11-16 NOTE — ED Notes (Signed)
 Patient is being discharged from the Urgent Care and sent to the Emergency Department via POV . Per Rilla Flood, NP, patient is in need of higher level of care due to Hypertension crisis. Patient is aware and verbalizes understanding of plan of care.  Vitals:   11/16/23 0939 11/16/23 0941  BP: (!) 177/137 (!) 189/135  Pulse: 100   Resp: 16   Temp: 98.8 F (37.1 C)   SpO2: 97%

## 2023-11-20 ENCOUNTER — Encounter: Payer: Self-pay | Admitting: Family Medicine

## 2023-11-20 ENCOUNTER — Other Ambulatory Visit: Payer: Self-pay

## 2023-11-20 ENCOUNTER — Ambulatory Visit: Payer: 59 | Admitting: Family Medicine

## 2023-11-20 VITALS — BP 150/103 | HR 77 | Ht 68.0 in | Wt 194.0 lb

## 2023-11-20 DIAGNOSIS — Z1159 Encounter for screening for other viral diseases: Secondary | ICD-10-CM

## 2023-11-20 DIAGNOSIS — Z91030 Bee allergy status: Secondary | ICD-10-CM

## 2023-11-20 DIAGNOSIS — E782 Mixed hyperlipidemia: Secondary | ICD-10-CM

## 2023-11-20 DIAGNOSIS — L409 Psoriasis, unspecified: Secondary | ICD-10-CM

## 2023-11-20 DIAGNOSIS — I152 Hypertension secondary to endocrine disorders: Secondary | ICD-10-CM | POA: Diagnosis not present

## 2023-11-20 DIAGNOSIS — E1159 Type 2 diabetes mellitus with other circulatory complications: Secondary | ICD-10-CM | POA: Diagnosis not present

## 2023-11-20 DIAGNOSIS — Z23 Encounter for immunization: Secondary | ICD-10-CM | POA: Diagnosis not present

## 2023-11-20 DIAGNOSIS — Z114 Encounter for screening for human immunodeficiency virus [HIV]: Secondary | ICD-10-CM | POA: Diagnosis not present

## 2023-11-20 DIAGNOSIS — Z7689 Persons encountering health services in other specified circumstances: Secondary | ICD-10-CM | POA: Insufficient documentation

## 2023-11-20 DIAGNOSIS — Z0000 Encounter for general adult medical examination without abnormal findings: Secondary | ICD-10-CM | POA: Insufficient documentation

## 2023-11-20 MED ORDER — EPINEPHRINE 0.3 MG/0.3ML IJ SOAJ
0.3000 mg | INTRAMUSCULAR | 1 refills | Status: DC | PRN
  Filled 2023-11-20: qty 2, 30d supply, fill #0

## 2023-11-20 MED ORDER — EMPAGLIFLOZIN 25 MG PO TABS
25.0000 mg | ORAL_TABLET | Freq: Every day | ORAL | 1 refills | Status: DC
  Filled 2023-11-20: qty 30, 30d supply, fill #0

## 2023-11-20 MED ORDER — FREESTYLE LANCETS MISC
1.0000 | Freq: Every day | 3 refills | Status: DC
  Filled 2023-11-20: qty 100, 90d supply, fill #0
  Filled 2024-01-31 – 2024-02-04 (×2): qty 100, 90d supply, fill #1
  Filled 2024-05-14: qty 100, 90d supply, fill #2
  Filled 2024-07-21 – 2024-08-12 (×2): qty 100, 90d supply, fill #3

## 2023-11-20 MED ORDER — BLOOD GLUCOSE MONITOR SYSTEM W/DEVICE KIT
1.0000 | PACK | Freq: Every day | 0 refills | Status: AC
  Filled 2023-11-20: qty 1, 30d supply, fill #0

## 2023-11-20 MED ORDER — LANCET DEVICE MISC
1.0000 | Freq: Every day | 0 refills | Status: AC
  Filled 2023-11-20 – 2024-05-14 (×2): qty 1, fill #0

## 2023-11-20 MED ORDER — BLOOD GLUCOSE TEST VI STRP
1.0000 | ORAL_STRIP | Freq: Every day | 3 refills | Status: DC
  Filled 2023-11-20: qty 100, 90d supply, fill #0
  Filled 2023-11-20: qty 50, 50d supply, fill #0
  Filled 2023-12-26: qty 100, 90d supply, fill #1
  Filled 2024-04-05: qty 100, 90d supply, fill #2
  Filled 2024-07-21: qty 100, 90d supply, fill #3
  Filled 2024-08-12 – 2024-09-24 (×3): qty 100, 90d supply, fill #4

## 2023-11-20 NOTE — Progress Notes (Signed)
New patient visit   Patient: Dennis Gibson   DOB: October 22, 1985   38 y.o. Male  MRN: [id number] Visit Date: 11/20/2023  Today's healthcare provider: Sherlyn Hay, DO   Chief Complaint  Patient presents with   Establish Care   Hypertension   Diabetes   Subjective    Dennis Gibson is a 38 y.o. male who presents today as a new patient to establish care.  Hypertension Pertinent negatives include no chest pain, headaches, palpitations or shortness of breath.  Diabetes Pertinent negatives for hypoglycemia include no headaches. Pertinent negatives for diabetes include no chest pain and no weakness.    Dennis Gibson is a 38 year old male with hypertension and diabetes who presents with elevated blood pressure and blood sugar levels. He is accompanied by his wife.  He initially visited urgent care for symptoms resembling a sinus infection, as described by his wife and mother. During this visit, he was informed of a hypertensive crisis and was advised to go to the ER. At the ER, his blood pressure was recorded as high as 202/130 mmHg, and his blood sugar was 333 mg/dL. He was also found to have a high level of protein in his urine. He was discharged with prescriptions for hydrochlorothiazide (HCTZ) and lisinopril, as well as an antibiotic.  He has been inconsistent with his blood pressure medications, having started them four days ago and taken them twice since then. He is trying to establish a routine of taking them every morning. He has not started any medication for his blood sugar, as he is waiting to establish care with a primary provider. He declined metformin due to concerns about side effects.  He is attempting to be more active and has made dietary changes, such as reducing sweets and soda intake. No chest pain, shortness of breath, dizziness, or lightheadedness at rest, though he experiences shortness of breath with exertion due to being out of shape.  Record review shows a  previous ER visit in May for an allergic reaction to a bee sting, during which his glucose was noted to be 247 mg/dL. He has a history of bee sting allergy, diagnosed approximately five years ago, with symptoms including swelling and itchy red dots and facial swelling with itchy throat, but no severe anaphylactic reactions recently. His EpiPen is expired, and he requires a new prescription.  He has psoriasis on his knees and elbows, which he does not treat as it does not bother him or his wife.   Past Medical History:  Diagnosis Date   Allergy    GERD (gastroesophageal reflux disease)    Hypertension    Past Surgical History:  Procedure Laterality Date   EYE SURGERY     INNER EAR SURGERY Right    Family Status  Relation Name Status   Mother  (Not Specified)   Father  (Not Specified)  No partnership data on file   Family History  Problem Relation Age of Onset   Hyperlipidemia Mother    Hypertension Mother    Diabetes Father    Social History   Socioeconomic History   Marital status: Married    Spouse name: Not on file   Number of children: Not on file   Years of education: Not on file   Highest education level: Not on file  Occupational History   Not on file  Tobacco Use   Smoking status: Never   Smokeless tobacco: Never  Vaping Use   Vaping  status: Never Used  Substance and Sexual Activity   Alcohol use: Never   Drug use: Never   Sexual activity: Yes  Other Topics Concern   Not on file  Social History Narrative   Not on file   Social Drivers of Health   Financial Resource Strain: Not on file  Food Insecurity: Not on file  Transportation Needs: Not on file  Physical Activity: Not on file  Stress: Not on file  Social Connections: Not on file   Outpatient Medications Prior to Visit  Medication Sig   amoxicillin-clavulanate (AUGMENTIN) 875-125 MG tablet Take 1 tablet by mouth 2 (two) times daily.   hydrochlorothiazide (HYDRODIURIL) 25 MG tablet Take 25 mg  by mouth daily.   lisinopril (ZESTRIL) 20 MG tablet Take 20 mg by mouth daily.   OVER THE COUNTER MEDICATION Multivitamin   [DISCONTINUED] lisinopril (ZESTRIL) 10 MG tablet Take 1 tablet (10 mg total) by mouth daily.   [DISCONTINUED] amoxicillin (AMOXIL) 500 MG capsule Take 1 capsule (500 mg total) by mouth 3 (three) times daily until gone.   [DISCONTINUED] ibuprofen (ADVIL) 800 MG tablet Take 1 tablet (800 mg total) by mouth every 6 (six) hours as needed for pain.   [DISCONTINUED] traMADol (ULTRAM) 50 MG tablet Take 1 tablet (50 mg total) by mouth every 6 (six) hours as needed for pain.   No facility-administered medications prior to visit.   Allergies  Allergen Reactions   Bee Venom Anaphylaxis   Other Rash    Immunization History  Administered Date(s) Administered   Influenza-Unspecified 06/19/2023   PNEUMOCOCCAL CONJUGATE-20 11/20/2023    Health Maintenance  Topic Date Due   HEMOGLOBIN A1C  Never done   OPHTHALMOLOGY EXAM  Never done   HIV Screening  Never done   Diabetic kidney evaluation - Urine ACR  Never done   Hepatitis C Screening  Never done   COVID-19 Vaccine (1 - 2024-25 season) 06/09/2024 (Originally 06/10/2023)   DTaP/Tdap/Td (1 - Tdap) 11/19/2024 (Originally 11/28/2002)   Diabetic kidney evaluation - eGFR measurement  02/14/2024   FOOT EXAM  11/19/2024   Pneumococcal Vaccine 9-40 Years old  Completed   INFLUENZA VACCINE  Completed   HPV VACCINES  Aged Out    Patient Care Team: Nicci Vaughan, Monico Blitz, DO as PCP - General (Family Medicine)  Review of Systems  Respiratory: Negative.  Negative for cough, shortness of breath and wheezing.   Cardiovascular:  Negative for chest pain, palpitations and leg swelling.  Neurological:  Negative for weakness and headaches.        Objective    BP (!) 150/103   Pulse 77   Ht 5\' 8"  (1.727 m)   Wt 194 lb (88 kg)   BMI 29.50 kg/m     Physical Exam Constitutional:      Appearance: Normal appearance.  HENT:      Head: Normocephalic and atraumatic.  Eyes:     General: No scleral icterus.    Conjunctiva/sclera: Conjunctivae normal.  Cardiovascular:     Pulses:          Dorsalis pedis pulses are 2+ on the right side and 2+ on the left side.       Posterior tibial pulses are 2+ on the right side and 2+ on the left side.  Musculoskeletal:     Right foot: Normal range of motion. No deformity, bunion, Charcot foot, foot drop or prominent metatarsal heads.     Left foot: Normal range of motion. No deformity, bunion, Charcot foot,  foot drop or prominent metatarsal heads.  Feet:     Right foot:     Protective Sensation: 10 sites tested.  10 sites sensed.     Skin integrity: No ulcer, blister, skin breakdown, erythema, warmth, callus, dry skin or fissure.     Toenail Condition: Right toenails are normal.     Left foot:     Protective Sensation: 10 sites tested.  10 sites sensed.     Skin integrity: No ulcer, blister, skin breakdown, erythema, warmth, callus, dry skin or fissure.     Toenail Condition: Left toenails are normal.  Neurological:     Mental Status: He is alert and oriented to person, place, and time. Mental status is at baseline.  Psychiatric:        Mood and Affect: Mood normal.        Behavior: Behavior normal.     Depression Screen    11/20/2023    8:51 AM  PHQ 2/9 Scores  PHQ - 2 Score 0   No results found for any visits on 11/20/23.  Assessment & Plan     Establishing care with new doctor, encounter for Assessment & Plan: Due for several health maintenance interventions including vaccinations and screenings. Discussed the benefits of the pneumonia vaccine, especially given the diabetes diagnosis, and the importance of an eye exam for diabetic retinopathy. - Administer pneumonia vaccine - Recommend eye exam for diabetic retinopathy - Request tetanus vaccination record - Perform HIV and hepatitis C screening   Hypertension associated with type 2 diabetes mellitus  (HCC) Assessment & Plan: Hypertension Hypertension, currently on hydrochlorothiazide 25 mg and lisinopril 20 mg. Blood pressure readings have reached 202/130 mmHg, indicating hypertensive crisis. Inconsistent medication adherence, having started the current regimen four days ago (and taken medication on only two days). Discussed the importance of consistent medication adherence and strategies to remember to take medications. - Continue lisinopril 20 mg daily - Continue hydrochlorothiazide 25 mg daily - Set an alarm to remind him to take medications - Recheck blood pressure in two weeks  Diabetes Mellitus Newly diagnosed diabetes mellitus with blood glucose levels of 333 mg/dL and 409 mg/dL. Refusal to take metformin due to concerns about side effects including diarrhea, nausea, and potential cancer risk. Agreed to try Jardiance. Emphasized the importance of dietary modifications and lifestyle changes. - Prescribe Jardiance - Order A1c test - Send prescription for glucose meter, strips, and lancets - Encourage dietary modifications and lifestyle changes - Follow up in two weeks to assess blood sugar control  Orders: -     Empagliflozin; Take 1 tablet (25 mg total) by mouth daily before breakfast.  Dispense: 30 tablet; Refill: 1 -     Blood Glucose Monitor System; Check blood sugar daily before breakfast.  Dispense: 1 kit; Refill: 0 -     Blood Glucose Test; Use to test blood sugar daily before breakfast. Use test strips covered by insurance.  Dispense: 100 strip; Refill: 3 -     Lancet Device; 1 each by Does not apply route daily before breakfast. May substitute to any manufacturer covered by patient's insurance.  Dispense: 1 each; Refill: 0 -     Accu-Chek Softclix Lancets; Use daily before breakfast.  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 Current EpiPen is expired. Discussed the importance of having an up-to-date EpiPen. - Send prescription for epinephrine auto-injector (two-pack)  Orders: -     EPINEPHrine; Inject 0.3 mg into the muscle as needed for anaphylaxis.  Dispense: 1 each; Refill: 1  Psoriasis Assessment & Plan: Noted to knees. Patient declines treatment.  Orders: -     Pneumococcal conjugate vaccine 20-valent  Encounter for screening for HIV -     HIV Antibody (routine testing w rflx)  Encounter for hepatitis C screening test for low risk patient -     HCV Ab w Reflex to Quant PCR  Encounter for Prevnar pneumococcal vaccination -     Pneumococcal conjugate vaccine 20-valent     Return in about 2 weeks (around 12/04/2023) for HTN.     I discussed the assessment and treatment plan with the patient  The patient was provided an opportunity to ask questions and all were answered. The patient agreed with the plan and demonstrated an understanding of the instructions.   The patient was advised to call back or seek an in-person evaluation if the symptoms worsen or if the condition fails to improve as anticipated.    Sherlyn Hay, DO  [facility] [phone] (phone) [phone] (fax)  [facility]

## 2023-11-20 NOTE — Assessment & Plan Note (Signed)
Due for several health maintenance interventions including vaccinations and screenings. Discussed the benefits of the pneumonia vaccine, especially given the diabetes diagnosis, and the importance of an eye exam for diabetic retinopathy. - Administer pneumonia vaccine - Recommend eye exam for diabetic retinopathy - Request tetanus vaccination record - Perform HIV and hepatitis C screening

## 2023-11-20 NOTE — Assessment & Plan Note (Signed)
Noted to knees. Patient declines treatment.

## 2023-11-20 NOTE — Assessment & Plan Note (Signed)
Allergic reactions to bee stings, including anaphylaxis. Current EpiPen is expired. Discussed the importance of having an up-to-date EpiPen. - Send prescription for epinephrine auto-injector (two-pack)

## 2023-11-20 NOTE — Assessment & Plan Note (Addendum)
Hypertension Hypertension, currently on hydrochlorothiazide 25 mg and lisinopril 20 mg. Blood pressure readings have reached 202/130 mmHg, indicating hypertensive crisis. Inconsistent medication adherence, having started the current regimen four days ago (and taken medication on only two days). Discussed the importance of consistent medication adherence and strategies to remember to take medications. - Continue lisinopril 20 mg daily - Continue hydrochlorothiazide 25 mg daily - Set an alarm to remind him to take medications - Recheck blood pressure in two weeks  Diabetes Mellitus Newly diagnosed diabetes mellitus with blood glucose levels of 333 mg/dL and 161 mg/dL. Refusal to take metformin due to concerns about side effects including diarrhea, nausea, and potential cancer risk. Agreed to try Jardiance. Emphasized the importance of dietary modifications and lifestyle changes. - Prescribe Jardiance - Order A1c test - Send prescription for glucose meter, strips, and lancets - Encourage dietary modifications and lifestyle changes - Follow up in two weeks to assess blood sugar control

## 2023-11-21 ENCOUNTER — Other Ambulatory Visit: Payer: Self-pay

## 2023-11-21 LAB — LIPID PANEL
Chol/HDL Ratio: 4.8 {ratio} (ref 0.0–5.0)
Cholesterol, Total: 229 mg/dL — ABNORMAL HIGH (ref 100–199)
HDL: 48 mg/dL (ref >39)
LDL Chol Calc (NIH): 151 mg/dL — ABNORMAL HIGH (ref 0–99)
Triglycerides: 164 mg/dL — ABNORMAL HIGH (ref 0–149)
VLDL Cholesterol Cal: 30 mg/dL (ref 5–40)

## 2023-11-21 LAB — HCV INTERPRETATION

## 2023-11-21 LAB — HEMOGLOBIN A1C
Est. average glucose Bld gHb Est-mCnc: 303 mg/dL
Hgb A1c MFr Bld: 12.2 % — ABNORMAL HIGH (ref 4.8–5.6)

## 2023-11-21 LAB — MICROALBUMIN / CREATININE URINE RATIO
Creatinine, Urine: 48.8 mg/dL
Microalb/Creat Ratio: 751 mg/g{creat} — ABNORMAL HIGH (ref 0–29)
Microalbumin, Urine: 366.3 ug/mL

## 2023-11-21 LAB — HIV ANTIBODY (ROUTINE TESTING W REFLEX): HIV Screen 4th Generation wRfx: NONREACTIVE

## 2023-11-21 LAB — HCV AB W REFLEX TO QUANT PCR: HCV Ab: NONREACTIVE

## 2023-11-29 DIAGNOSIS — R739 Hyperglycemia, unspecified: Secondary | ICD-10-CM | POA: Diagnosis not present

## 2023-12-03 ENCOUNTER — Encounter: Payer: Self-pay | Admitting: Family Medicine

## 2023-12-03 ENCOUNTER — Other Ambulatory Visit: Payer: Self-pay

## 2023-12-03 DIAGNOSIS — E782 Mixed hyperlipidemia: Secondary | ICD-10-CM

## 2023-12-03 DIAGNOSIS — I152 Hypertension secondary to endocrine disorders: Secondary | ICD-10-CM

## 2023-12-03 MED ORDER — ROSUVASTATIN CALCIUM 10 MG PO TABS
10.0000 mg | ORAL_TABLET | Freq: Every day | ORAL | 3 refills | Status: DC
  Filled 2023-12-03: qty 90, 90d supply, fill #0

## 2023-12-03 MED ORDER — SITAGLIPTIN PHOSPHATE 100 MG PO TABS
100.0000 mg | ORAL_TABLET | Freq: Every day | ORAL | 1 refills | Status: DC
Start: 2023-12-03 — End: 2024-01-31
  Filled 2023-12-03: qty 30, 30d supply, fill #0
  Filled 2024-01-01: qty 30, 30d supply, fill #1

## 2023-12-03 NOTE — Addendum Note (Signed)
 Addended by: Jacquenette Shone on: 12/03/2023 08:05 AM   Modules accepted: Orders

## 2023-12-14 ENCOUNTER — Encounter: Payer: Self-pay | Admitting: Family Medicine

## 2023-12-14 ENCOUNTER — Ambulatory Visit: Payer: 59 | Admitting: Family Medicine

## 2023-12-14 ENCOUNTER — Other Ambulatory Visit: Payer: Self-pay

## 2023-12-14 VITALS — BP 123/81 | HR 65 | Ht 68.0 in | Wt 235.0 lb

## 2023-12-14 DIAGNOSIS — H6121 Impacted cerumen, right ear: Secondary | ICD-10-CM | POA: Diagnosis not present

## 2023-12-14 DIAGNOSIS — I152 Hypertension secondary to endocrine disorders: Secondary | ICD-10-CM | POA: Diagnosis not present

## 2023-12-14 DIAGNOSIS — R591 Generalized enlarged lymph nodes: Secondary | ICD-10-CM | POA: Diagnosis not present

## 2023-12-14 DIAGNOSIS — E1159 Type 2 diabetes mellitus with other circulatory complications: Secondary | ICD-10-CM | POA: Diagnosis not present

## 2023-12-14 MED ORDER — LISINOPRIL-HYDROCHLOROTHIAZIDE 20-25 MG PO TABS
1.0000 | ORAL_TABLET | Freq: Every day | ORAL | 3 refills | Status: DC
  Filled 2023-12-14: qty 90, 90d supply, fill #0
  Filled 2024-03-12: qty 90, 90d supply, fill #1

## 2023-12-14 NOTE — Progress Notes (Signed)
 Established patient visit   Patient: Dennis Gibson   DOB: [birth year]   38 y.o. Male  MRN: [id number] Visit Date: 12/14/2023  Today's healthcare provider: Sherlyn Hay, DO   Chief Complaint  Patient presents with   Medical Management of Chronic Issues   Subjective    HPI The patient, with type 2 diabetes and hypertension, presents for management of these conditions. He is accompanied by his wife.  He has been managing type 2 diabetes with a recent switch from [place] to [place], due to cost, though he cannot recall the exact start date. He estimates a 2-3 day lapse before beginning Januvia after it was prescribed on February 12th. Blood sugar levels have been decreasing, with morning readings now between 160-180 mg/dL, especially after physical activity. He is currently taking Januvia 100 mg daily.  For hypertension, he is on lisinopril and HCTZ, which he needs to be refilled. Blood pressure readings at home are generally in the 110s-120s/70s-80s, with occasional higher readings attributed to factors like waking up cold or in pain.  He discusses dietary changes, including reducing soda intake by switching to sugar-free flavored water and Gatorade Zero. He and his wife are focusing on consuming more natural sugars from fruits like oranges and berries, and are considering incorporating yogurt smoothies into their diet. He prefers vanilla yogurt over plain due to taste, and is exploring [place] yogurt for its higher protein content.  He mentions a recent dental procedure involving a temporary crown, after which he experienced jaw soreness and swelling in the lymph nodes near the jaw. This has been ongoing for just under two weeks. No ear pain or throat pain, but there is discomfort when turning his head in certain directions. X-rays were taken at the time of the dental procedure.     Medications: Outpatient Medications Prior to Visit  Medication Sig   Blood Glucose Monitoring  Suppl (BLOOD GLUCOSE MONITOR SYSTEM) w/Device KIT Check blood sugar daily before breakfast.   EPINEPHrine 0.3 mg/0.3 mL IJ SOAJ injection Inject 0.3 mg into the muscle as needed for anaphylaxis.   Glucose Blood (BLOOD GLUCOSE TEST STRIPS) STRP Use to test blood sugar daily before breakfast. Use test strips covered by insurance.   Lancet Device MISC 1 each by Does not apply route daily before breakfast. May substitute to any manufacturer covered by patient's insurance.   Lancets (FREESTYLE) lancets Use daily before breakfast.   OVER THE COUNTER MEDICATION Multivitamin   rosuvastatin (CRESTOR) 10 MG tablet Take 1 tablet (10 mg total) by mouth at bedtime.   sitaGLIPtin (JANUVIA) 100 MG tablet Take 1 tablet (100 mg total) by mouth daily.   [DISCONTINUED] hydrochlorothiazide (HYDRODIURIL) 25 MG tablet Take 25 mg by mouth daily.   [DISCONTINUED] lisinopril (ZESTRIL) 20 MG tablet Take 20 mg by mouth daily.   [DISCONTINUED] amoxicillin-clavulanate (AUGMENTIN) 875-125 MG tablet Take 1 tablet by mouth 2 (two) times daily.   [DISCONTINUED] empagliflozin (JARDIANCE) 25 MG TABS tablet Take 1 tablet (25 mg total) by mouth daily before breakfast.   No facility-administered medications prior to visit.        Objective    BP 123/81   Pulse 65   Ht 5\' 8"  (1.727 m)   Wt 235 lb (106.6 kg)   BMI 35.73 kg/m     Physical Exam Vitals and nursing note reviewed.  Constitutional:      General: He is not in acute distress.    Appearance: Normal appearance.  HENT:  Head: Normocephalic and atraumatic.     Right Ear: External ear normal. There is impacted cerumen.     Left Ear: Tympanic membrane, ear canal and external ear normal.     Nose: Nose normal.     Mouth/Throat:     Mouth: Mucous membranes are moist.     Pharynx: Oropharynx is clear.  Eyes:     General: No scleral icterus.    Conjunctiva/sclera: Conjunctivae normal.  Cardiovascular:     Rate and Rhythm: Normal rate.  Pulmonary:      Effort: Pulmonary effort is normal.  Neurological:     Mental Status: He is alert and oriented to person, place, and time. Mental status is at baseline.  Psychiatric:        Mood and Affect: Mood normal.        Behavior: Behavior normal.      No results found for any visits on 12/14/23.  Assessment & Plan    Hypertension associated with type 2 diabetes mellitus (HCC) Assessment & Plan: Type 2 Diabetes Mellitus   Blood sugars have been high but are improving with recent dietary changes and increased physical activity. Off Jardiance and started on Januvia. Blood sugars now in the 160s to 180s in the morning, occasionally dropping to the 150s after physical activity. Discussed benefits of dietary changes, including reducing high-sugar foods and incorporating more fruits, particularly berries. Encouraged to consider [place] yogurt for higher protein content. Explained that oranges, while better than orange juice, can still raise blood sugar levels quickly due to their high sugar content.   - Continue Januvia 100 mg daily   - Continue rosuvastatin 10 mg daily at bedtime - Encourage dietary changes and increased physical activity   - Schedule an eye exam ASAP and annually thereafter   - Recheck A1c in 2 months    Hypertension   Blood pressure well-controlled with current medications, with readings mostly in the 110s to 120s over 70s to 80s. One high reading noted, possibly due to pain and cold exposure. Discussed option of combining lisinopril and HCTZ into a single pill to potentially reduce copay costs. Explained that as exercise increases, blood pressure may improve further, which can also help lower blood sugar levels.   - Continue lisinopril and HCTZ   - Send prescription for combined pill if cost-effective   - Monitor blood pressure regularly    Orders: -     Lisinopril-hydroCHLOROthiazide; Take 1 tablet by mouth daily.  Dispense: 90 tablet; Refill: 3  Lymphadenopathy Assessment &  Plan: Reports jaw pain and swollen lymph nodes following a recent dental procedure for a temporary crown. No signs of infection or ear pain noted. Swelling present for just under two weeks. Advised to monitor the swelling and consider further evaluation if it persists beyond a month. Discussed that the swelling may be reactive and should subside over time.   - Monitor swelling of lymph nodes   - Consider ultrasound if swelling persists beyond one month      Right ear impacted cerumen Assessment & Plan: Unable to remove manually (attempted to be able to view TM). Discussed debrox and possible ear irrigation. Will defer per patient preference given absence of ear symptoms   General Health Maintenance   Discussed importance of regular eye exams due to diabetes. Encouraged dietary changes to manage blood sugar levels. Explained that regular physical activity and dietary changes can significantly impact overall health and diabetes management.   - Schedule eye exam ASAP and annually  thereafter   - Encourage dietary changes and increased physical activity     Return in about 2 months (around 02/18/2024) for DM, HTN.      I discussed the assessment and treatment plan with the patient  The patient was provided an opportunity to ask questions and all were answered. The patient agreed with the plan and demonstrated an understanding of the instructions.   The patient was advised to call back or seek an in-person evaluation if the symptoms worsen or if the condition fails to improve as anticipated.    Sherlyn Hay, DO  [facility] [phone] (phone) [phone] (fax)  [facility]

## 2023-12-14 NOTE — Assessment & Plan Note (Signed)
 Unable to remove manually (attempted to be able to view TM). Discussed debrox and possible ear irrigation. Will defer per patient preference given absence of ear symptoms

## 2023-12-14 NOTE — Assessment & Plan Note (Addendum)
 Reports jaw pain and swollen lymph nodes following a recent dental procedure for a temporary crown. No signs of infection or ear pain noted. Swelling present for just under two weeks. Advised to monitor the swelling and consider further evaluation if it persists beyond a month. Discussed that the swelling may be reactive and should subside over time.   - Monitor swelling of lymph nodes   - Consider ultrasound if swelling persists beyond one month

## 2023-12-14 NOTE — Assessment & Plan Note (Addendum)
 Type 2 Diabetes Mellitus   Blood sugars have been high but are improving with recent dietary changes and increased physical activity. Off Jardiance and started on Januvia. Blood sugars now in the 160s to 180s in the morning, occasionally dropping to the 150s after physical activity. Discussed benefits of dietary changes, including reducing high-sugar foods and incorporating more fruits, particularly berries. Encouraged to consider [place] yogurt for higher protein content. Explained that oranges, while better than orange juice, can still raise blood sugar levels quickly due to their high sugar content.   - Continue Januvia 100 mg daily   - Continue rosuvastatin 10 mg daily at bedtime - Encourage dietary changes and increased physical activity   - Schedule an eye exam ASAP and annually thereafter   - Recheck A1c in 2 months    Hypertension   Blood pressure well-controlled with current medications, with readings mostly in the 110s to 120s over 70s to 80s. One high reading noted, possibly due to pain and cold exposure. Discussed option of combining lisinopril and HCTZ into a single pill to potentially reduce copay costs. Explained that as exercise increases, blood pressure may improve further, which can also help lower blood sugar levels.   - Continue lisinopril and HCTZ   - Send prescription for combined pill if cost-effective   - Monitor blood pressure regularly

## 2023-12-26 ENCOUNTER — Other Ambulatory Visit: Payer: Self-pay

## 2023-12-26 DIAGNOSIS — H663X3 Other chronic suppurative otitis media, bilateral: Secondary | ICD-10-CM | POA: Diagnosis not present

## 2023-12-26 DIAGNOSIS — H6983 Other specified disorders of Eustachian tube, bilateral: Secondary | ICD-10-CM | POA: Diagnosis not present

## 2023-12-26 DIAGNOSIS — H6981 Other specified disorders of Eustachian tube, right ear: Secondary | ICD-10-CM | POA: Diagnosis not present

## 2023-12-31 ENCOUNTER — Other Ambulatory Visit: Payer: Self-pay

## 2024-01-01 ENCOUNTER — Encounter: Payer: Self-pay | Admitting: Family Medicine

## 2024-01-31 ENCOUNTER — Other Ambulatory Visit: Payer: Self-pay

## 2024-01-31 ENCOUNTER — Other Ambulatory Visit: Payer: Self-pay | Admitting: Family Medicine

## 2024-01-31 DIAGNOSIS — E1159 Type 2 diabetes mellitus with other circulatory complications: Secondary | ICD-10-CM

## 2024-01-31 MED ORDER — SITAGLIPTIN PHOSPHATE 100 MG PO TABS
100.0000 mg | ORAL_TABLET | Freq: Every day | ORAL | 1 refills | Status: DC
  Filled 2024-01-31: qty 30, 30d supply, fill #0

## 2024-02-01 ENCOUNTER — Other Ambulatory Visit: Payer: Self-pay

## 2024-02-13 ENCOUNTER — Encounter: Payer: Self-pay | Admitting: Family Medicine

## 2024-02-13 ENCOUNTER — Ambulatory Visit: Admitting: Family Medicine

## 2024-02-13 ENCOUNTER — Other Ambulatory Visit: Payer: Self-pay

## 2024-02-13 VITALS — BP 125/76 | HR 61 | Resp 16 | Ht 68.0 in | Wt 227.3 lb

## 2024-02-13 DIAGNOSIS — I152 Hypertension secondary to endocrine disorders: Secondary | ICD-10-CM

## 2024-02-13 DIAGNOSIS — E1159 Type 2 diabetes mellitus with other circulatory complications: Secondary | ICD-10-CM

## 2024-02-13 DIAGNOSIS — Z7984 Long term (current) use of oral hypoglycemic drugs: Secondary | ICD-10-CM

## 2024-02-13 DIAGNOSIS — E782 Mixed hyperlipidemia: Secondary | ICD-10-CM | POA: Diagnosis not present

## 2024-02-13 MED ORDER — SITAGLIPTIN PHOSPHATE 100 MG PO TABS
100.0000 mg | ORAL_TABLET | Freq: Every day | ORAL | 3 refills | Status: AC
  Filled 2024-02-13 – 2024-02-27 (×2): qty 90, 90d supply, fill #0
  Filled 2024-06-09: qty 90, 90d supply, fill #1
  Filled 2024-09-03: qty 90, 90d supply, fill #2

## 2024-02-13 NOTE — Progress Notes (Signed)
 Established patient visit   Patient: Dennis Gibson   DOB: July 10, 1986   38 y.o. Male  MRN: [id number] Visit Date: 02/13/2024  Today's healthcare provider: Carlean Charter, DO   Chief Complaint  Patient presents with   Hypertension    HTN/BP f/u . Wants rt ear checked   Subjective    Hypertension Pertinent negatives include no chest pain, headaches, palpitations or shortness of breath.   Dennis Gibson is a 39 year old male with diabetes who presents for follow-up of blood sugar management.  He has experienced several spikes in blood sugar levels, particularly in the mornings, which he attributes to lack of physical activity or consumption of sweets and high-carbohydrate foods such as donuts and pasta. A specific instance was noted where his blood sugar was 235 mg/dL the morning after consuming donuts. His average morning blood sugar is 167 mg/dL. He has recently started using a Reli-on meter to test his blood sugar at work during lunch or when he gets home at night to better understand how his food intake affects his blood sugar levels.  He recalls an episode during a field trip last Monday where he experienced symptoms suggestive of a drop in blood sugar or blood pressure. After a lot of walking, he felt soreness in his jaw and subsequently felt faint, requiring him to sit down and consume a Gatorade and half a chocolate chip cookie. He felt better after about 30 minutes.  He denies experiencing any side effects such as dizziness or lightheadedness from his current medications. He is currently on Januvia  for his diabetes management. He has previously tried Jardiance  but found it costly.  He mentions an increase in bathroom visits (stool), which he attributes to the addition of pre-made protein shakes to his diet. No numbness or tingling.  He denies fatigue except after prolonged physical activity, such as playing soccer for 45 minutes.  He endorses his physical endurance is much  better than it was previously though.      Medications: Outpatient Medications Prior to Visit  Medication Sig   Blood Glucose Monitoring Suppl (BLOOD GLUCOSE MONITOR SYSTEM) w/Device KIT Check blood sugar daily before breakfast.   EPINEPHrine  0.3 mg/0.3 mL IJ SOAJ injection Inject 0.3 mg into the muscle as needed for anaphylaxis.   Glucose Blood (BLOOD GLUCOSE TEST STRIPS) STRP Use to test blood sugar daily before breakfast. Use test strips covered by insurance.   Lancet Device MISC 1 each by Does not apply route daily before breakfast. May substitute to any manufacturer covered by patient's insurance.   Lancets (FREESTYLE) lancets Use daily before breakfast.   lisinopril -hydrochlorothiazide  (ZESTORETIC ) 20-25 MG tablet Take 1 tablet by mouth daily.   OVER THE COUNTER MEDICATION Multivitamin   rosuvastatin  (CRESTOR ) 10 MG tablet Take 1 tablet (10 mg total) by mouth at bedtime.   [DISCONTINUED] sitaGLIPtin  (JANUVIA ) 100 MG tablet Take 1 tablet (100 mg total) by mouth daily.   No facility-administered medications prior to visit.    Review of Systems  Respiratory: Negative.  Negative for cough, shortness of breath and wheezing.   Cardiovascular:  Negative for chest pain, palpitations and leg swelling.  Gastrointestinal:  Negative for abdominal pain, constipation, diarrhea, nausea and vomiting.  Neurological:  Negative for dizziness, weakness, light-headedness and headaches.        Objective    BP 125/76 (BP Location: Left Arm, Patient Position: Sitting, Cuff Size: Normal)   Pulse 61   Resp 16   Ht  5\' 8"  (1.727 m)   Wt 227 lb 4.8 oz (103.1 kg)   SpO2 100%   BMI 34.56 kg/m     Physical Exam Vitals and nursing note reviewed.  Constitutional:      General: He is not in acute distress.    Appearance: Normal appearance.  HENT:     Head: Normocephalic and atraumatic.  Eyes:     General: No scleral icterus.    Conjunctiva/sclera: Conjunctivae normal.  Cardiovascular:      Rate and Rhythm: Normal rate.  Pulmonary:     Effort: Pulmonary effort is normal.  Neurological:     Mental Status: He is alert and oriented to person, place, and time. Mental status is at baseline.  Psychiatric:        Mood and Affect: Mood normal.        Behavior: Behavior normal.      No results found for any visits on 02/13/24.  Assessment & Plan    Hypertension associated with type 2 diabetes mellitus (HCC) -     Microalbumin / creatinine urine ratio -     Comprehensive metabolic panel with GFR -     Hemoglobin A1c -     SITagliptin  Phosphate; Take 1 tablet (100 mg total) by mouth daily.  Dispense: 90 tablet; Refill: 3  Mixed hyperlipidemia -     Lipid panel    Type 2 diabetes mellitus with hyperglycemia Recent blood glucose spikes due to inactivity and dietary indiscretions. Improved with increased monitoring. Possible hypoglycemia episode resolved with food. Current medication: Januvia . Discussed Jardiance  or [place] addition.  Patient prefers to avoid insulin.  Discussed possibility of Ozempic.  - Order blood work: A1c, cholesterol panel, metabolic panel post-May 11. - Consider Farxiga or Jardiance  if A1c elevated.  If too expensive, will consider adding sulfonylurea or thiazolidinedione. - Discuss Ozempic switch if needed. - Provide continuous glucose monitor trial to better learn how different foods affect blood sugar. - Advise dietary modifications for carbohydrate management. - Encourage regular exercise. - Schedule eye exam for diabetic retinopathy screening.  Blood pressure chronic, stable.  It is well-controlled today.  Continue lisinopril -hydrochlorothiazide  20-25 mg daily  Mixed hyperlipidemia We will recheck a lipid panel today.  Continue rosuvastatin  10 mg daily.    Return in about 14 weeks (around 05/21/2024) for CPE, DM, HTN.      I discussed the assessment and treatment plan with the patient  The patient was provided an opportunity to ask questions  and all were answered. The patient agreed with the plan and demonstrated an understanding of the instructions.   The patient was advised to call back or seek an in-person evaluation if the symptoms worsen or if the condition fails to improve as anticipated.    Carlean Charter, DO  [facility] [phone] (phone) [phone] (fax)  [facility]

## 2024-02-18 DIAGNOSIS — I152 Hypertension secondary to endocrine disorders: Secondary | ICD-10-CM | POA: Diagnosis not present

## 2024-02-18 DIAGNOSIS — E1159 Type 2 diabetes mellitus with other circulatory complications: Secondary | ICD-10-CM | POA: Diagnosis not present

## 2024-02-18 DIAGNOSIS — E782 Mixed hyperlipidemia: Secondary | ICD-10-CM | POA: Diagnosis not present

## 2024-02-19 LAB — COMPREHENSIVE METABOLIC PANEL WITH GFR
ALT: 52 IU/L — ABNORMAL HIGH (ref 0–44)
AST: 32 IU/L (ref 0–40)
Albumin: 4.9 g/dL (ref 4.1–5.1)
Alkaline Phosphatase: 114 IU/L (ref 44–121)
BUN/Creatinine Ratio: 17 (ref 9–20)
BUN: 18 mg/dL (ref 6–24)
Bilirubin Total: 0.2 mg/dL (ref 0.0–1.2)
CO2: 23 mmol/L (ref 20–29)
Calcium: 10.3 mg/dL — ABNORMAL HIGH (ref 8.7–10.2)
Chloride: 102 mmol/L (ref 96–106)
Creatinine, Ser: 1.08 mg/dL (ref 0.76–1.27)
Globulin, Total: 2.4 g/dL (ref 1.5–4.5)
Glucose: 121 mg/dL — ABNORMAL HIGH (ref 70–99)
Potassium: 4.6 mmol/L (ref 3.5–5.2)
Sodium: 143 mmol/L (ref 134–144)
Total Protein: 7.3 g/dL (ref 6.0–8.5)
eGFR: 89 mL/min/{1.73_m2} (ref >59)

## 2024-02-19 LAB — LIPID PANEL
Chol/HDL Ratio: 3.9 ratio (ref 0.0–5.0)
Cholesterol, Total: 174 mg/dL (ref 100–199)
HDL: 45 mg/dL (ref >39)
LDL Chol Calc (NIH): 94 mg/dL (ref 0–99)
Triglycerides: 204 mg/dL — ABNORMAL HIGH (ref 0–149)
VLDL Cholesterol Cal: 35 mg/dL (ref 5–40)

## 2024-02-19 LAB — MICROALBUMIN / CREATININE URINE RATIO
Creatinine, Urine: 95.3 mg/dL
Microalb/Creat Ratio: 88 mg/g{creat} — ABNORMAL HIGH (ref 0–29)
Microalbumin, Urine: 83.8 ug/mL

## 2024-02-19 LAB — HEMOGLOBIN A1C
Est. average glucose Bld gHb Est-mCnc: 197 mg/dL
Hgb A1c MFr Bld: 8.5 % — ABNORMAL HIGH (ref 4.8–5.6)

## 2024-02-25 ENCOUNTER — Ambulatory Visit: Payer: Self-pay | Admitting: Family Medicine

## 2024-02-25 ENCOUNTER — Other Ambulatory Visit: Payer: Self-pay

## 2024-02-25 DIAGNOSIS — E1159 Type 2 diabetes mellitus with other circulatory complications: Secondary | ICD-10-CM

## 2024-02-25 DIAGNOSIS — E1169 Type 2 diabetes mellitus with other specified complication: Secondary | ICD-10-CM

## 2024-02-25 MED ORDER — DAPAGLIFLOZIN PROPANEDIOL 10 MG PO TABS
10.0000 mg | ORAL_TABLET | Freq: Every day | ORAL | 11 refills | Status: AC
Start: 2024-02-25 — End: ?
  Filled 2024-02-25: qty 90, 90d supply, fill #0
  Filled 2024-06-09: qty 90, 90d supply, fill #1
  Filled 2024-09-03 (×2): qty 90, 90d supply, fill #2

## 2024-02-25 MED ORDER — ROSUVASTATIN CALCIUM 20 MG PO TABS
20.0000 mg | ORAL_TABLET | Freq: Every day | ORAL | 3 refills | Status: AC
  Filled 2024-02-25: qty 90, 90d supply, fill #0
  Filled 2024-06-09: qty 90, 90d supply, fill #1
  Filled 2024-09-03: qty 90, 90d supply, fill #2

## 2024-02-27 ENCOUNTER — Other Ambulatory Visit: Payer: Self-pay

## 2024-05-12 ENCOUNTER — Encounter: Payer: Self-pay | Admitting: Family Medicine

## 2024-05-14 ENCOUNTER — Other Ambulatory Visit: Payer: Self-pay

## 2024-05-23 ENCOUNTER — Encounter: Payer: Self-pay | Admitting: Family Medicine

## 2024-05-23 ENCOUNTER — Other Ambulatory Visit: Payer: Self-pay

## 2024-05-23 ENCOUNTER — Ambulatory Visit (INDEPENDENT_AMBULATORY_CARE_PROVIDER_SITE_OTHER): Admitting: Family Medicine

## 2024-05-23 VITALS — BP 127/89 | HR 66 | Temp 97.9°F | Ht 68.0 in | Wt 223.3 lb

## 2024-05-23 DIAGNOSIS — Z7984 Long term (current) use of oral hypoglycemic drugs: Secondary | ICD-10-CM | POA: Diagnosis not present

## 2024-05-23 DIAGNOSIS — I152 Hypertension secondary to endocrine disorders: Secondary | ICD-10-CM | POA: Diagnosis not present

## 2024-05-23 DIAGNOSIS — E1159 Type 2 diabetes mellitus with other circulatory complications: Secondary | ICD-10-CM | POA: Diagnosis not present

## 2024-05-23 DIAGNOSIS — E1121 Type 2 diabetes mellitus with diabetic nephropathy: Secondary | ICD-10-CM

## 2024-05-23 DIAGNOSIS — E782 Mixed hyperlipidemia: Secondary | ICD-10-CM

## 2024-05-23 DIAGNOSIS — R7401 Elevation of levels of liver transaminase levels: Secondary | ICD-10-CM

## 2024-05-23 DIAGNOSIS — Z0000 Encounter for general adult medical examination without abnormal findings: Secondary | ICD-10-CM | POA: Diagnosis not present

## 2024-05-23 DIAGNOSIS — L409 Psoriasis, unspecified: Secondary | ICD-10-CM | POA: Diagnosis not present

## 2024-05-23 MED ORDER — LISINOPRIL-HYDROCHLOROTHIAZIDE 20-12.5 MG PO TABS
1.0000 | ORAL_TABLET | Freq: Every day | ORAL | 3 refills | Status: DC
  Filled 2024-05-23: qty 90, 90d supply, fill #0

## 2024-05-23 NOTE — Assessment & Plan Note (Addendum)
 Type 2 Diabetes Mellitus   Fairly well-controlled based on record brought in today, with glucoses ranging 110s-160s. - Continue Januvia  100 mg daily and Farxiga  10 mg daily. - Continue rosuvastatin  10 mg daily at bedtime - Encourage dietary changes and increased physical activity   - Schedule an eye exam ASAP and annually thereafter     Hypertension   Chronic, with recent fluctuations. Dizziness on full dose led to self-adjustment. Blood pressure normalizing with half tablet. Potential dehydration noted. Adjusted hydrochlorothiazide  due to likely impact by Farxiga  on blood pressure.  - Home blood pressure cuff validated today.  - Continue lisinopril -hydrochlorothiazide , with reduced dose of hydrochlorothiazide  (dosage now: 20-12.5 mg). - Monitor blood pressure regularly. - Encourage adequate hydration.

## 2024-05-23 NOTE — Progress Notes (Signed)
 Complete physical exam   Patient: Dennis Gibson   DOB: [birth year]   38 y.o. Male  MRN: [id number] Visit Date: 05/23/2024  Today's healthcare provider: LAURAINE LOISE BUOY, DO   Chief Complaint  Patient presents with   Annual Exam    Patient presents for CPE, follow up on DM and HTN. Diet: Normal, tries to avoid sodas  Exercise: 3-5 days, 30 mins-1 hr Sleep: Patient feels like he does not get enough, thinks it is due to work schedule  Overall Feeling: States he feels like BP meds are making him dizzy. Reports that he will bend over and stand up and feel dizzy. Patient states when he takes stairs at work he notices his heart beating really fast and states he does not feel right     Subjective    Dennis Gibson is a 38 y.o. male who presents today for a complete physical exam.   HPI HPI     Annual Exam    Additional comments: Patient presents for CPE, follow up on DM and HTN. Diet: Normal, tries to avoid sodas  Exercise: 3-5 days, 30 mins-1 hr Sleep: Patient feels like he does not get enough, thinks it is due to work schedule  Overall Feeling: States he feels like BP meds are making him dizzy. Reports that he will bend over and stand up and feel dizzy. Patient states when he takes stairs at work he notices his heart beating really fast and states he does not feel right        Last edited by Cherry Chiquita CHRISTELLA, CMA on 05/23/2024  8:38 AM.      Dennis Gibson is a 38 year old male with hypertension and diabetes who presents for an annual physical exam.  He has a history of hypertension and diabetes. Blood pressure readings have been in the 110s to 120s over 70s to low 80s. He stopped taking his blood pressure medication about two weeks ago due to exertional symptoms, such as feeling like he had just run after climbing a flight of stairs. He resumed taking a half tablet of his medication when his blood pressure started to rise again, which normalized his readings but also brought  back some dizziness.  Blood sugar levels have been generally between 110s to 160s, with a notable low of 103. During vacation in July, he did not keep good records of his blood sugar levels. He has been taking all his medications except for the blood pressure medication during that time. Blood sugars reported to be below 100 when he returned to work after vacation, with the lowest being 79 and the highest 98.  He is currently on lisinopril -hydrochlorothiazide  20-25 mg, which we combined into one pill in March. He started taking these medications after an ER visit in February. He also started Farxiga .  He exercises for 30 minutes to an hour, three to five days a week, and monitors his diet, avoiding sodas most of the time. He occasionally drinks regular soda when he forgets to buy water.  He experiences psoriasis, which he manages by avoiding scratching to prevent bleeding. He takes Zyrtec for allergies when needed.     Past Medical History:  Diagnosis Date   Allergy    GERD (gastroesophageal reflux disease)    Hypertension    Past Surgical History:  Procedure Laterality Date   EYE SURGERY     INNER EAR SURGERY Right    Social History   Socioeconomic History  Marital status: Married    Spouse name: Not on file   Number of children: Not on file   Years of education: Not on file   Highest education level: Associate degree: academic program  Occupational History   Not on file  Tobacco Use   Smoking status: Never   Smokeless tobacco: Never  Vaping Use   Vaping status: Never Used  Substance and Sexual Activity   Alcohol use: Yes    Comment: Maybe 1 time per year per patient   Drug use: Never   Sexual activity: Yes    Birth control/protection: None  Other Topics Concern   Not on file  Social History Narrative   Not on file   Social Drivers of Health   Financial Resource Strain: Medium Risk (05/21/2024)   Overall Financial Resource Strain (CARDIA)    Difficulty of Paying  Living Expenses: Somewhat hard  Food Insecurity: Food Insecurity Present (05/21/2024)   Hunger Vital Sign    Worried About Running Out of Food in the Last Year: Sometimes true    Ran Out of Food in the Last Year: Sometimes true  Transportation Needs: No Transportation Needs (05/21/2024)   PRAPARE - Administrator, Civil Service (Medical): No    Lack of Transportation (Non-Medical): No  Physical Activity: Insufficiently Active (05/21/2024)   Exercise Vital Sign    Days of Exercise per Week: 4 days    Minutes of Exercise per Session: 20 min  Stress: No Stress Concern Present (05/21/2024)   [employer] of Occupational Health - Occupational Stress Questionnaire    Feeling of Stress: Only a little  Social Connections: Moderately Integrated (05/21/2024)   Social Connection and Isolation Panel    Frequency of Communication with Friends and Family: Twice a week    Frequency of Social Gatherings with Friends and Family: Once a week    Attends Religious Services: More than 4 times per year    Active Member of [employer] or Organizations: No    Attends Engineer, structural: Not on file    Marital Status: Married  Catering manager Violence: Not on file   Family Status  Relation Name Status   Mother Mom (Not Specified)   Father Dad (Not Specified)  No partnership data on file   Family History  Problem Relation Age of Onset   Hyperlipidemia Mother    Hypertension Mother    Diabetes Father    Allergies  Allergen Reactions   Bee Venom Anaphylaxis   Other Rash    Patient Care Team: Izella Ybanez N, DO as PCP - General (Family Medicine)   Medications: Outpatient Medications Prior to Visit  Medication Sig Note   Blood Glucose Monitoring Suppl (BLOOD GLUCOSE MONITOR SYSTEM) w/Device KIT Check blood sugar daily before breakfast.    dapagliflozin  propanediol (FARXIGA ) 10 MG TABS tablet Take 1 tablet (10 mg total) by mouth daily before breakfast.    EPINEPHrine  0.3 mg/0.3  mL IJ SOAJ injection Inject 0.3 mg into the muscle as needed for anaphylaxis.    Glucose Blood (BLOOD GLUCOSE TEST STRIPS) STRP Use to test blood sugar daily before breakfast. Use test strips covered by insurance.    Lancet Device MISC 1 each by Does not apply route daily before breakfast. May substitute to any manufacturer covered by patient's insurance.    Lancets (FREESTYLE) lancets Use daily before breakfast.    OVER THE COUNTER MEDICATION Multivitamin    rosuvastatin  (CRESTOR ) 20 MG tablet Take 1 tablet (20 mg total)  by mouth daily.    sitaGLIPtin  (JANUVIA ) 100 MG tablet Take 1 tablet (100 mg total) by mouth daily.    [DISCONTINUED] lisinopril -hydrochlorothiazide  (ZESTORETIC ) 20-25 MG tablet Take 1 tablet by mouth daily. (Patient taking differently: Take 0.5 tablets by mouth daily.) 05/23/2024: lightheaded; improved when not taking BP but experienced BP elevations thereafter.   No facility-administered medications prior to visit.    Review of Systems  Constitutional:  Negative for appetite change, chills, fatigue and fever.  HENT:  Negative for congestion, ear pain, hearing loss, nosebleeds and trouble swallowing.   Eyes:  Negative for pain and visual disturbance.  Respiratory:  Negative for cough, chest tightness and shortness of breath.   Cardiovascular:  Negative for chest pain, palpitations and leg swelling.  Gastrointestinal:  Negative for abdominal pain, blood in stool, constipation, diarrhea, nausea and vomiting.  Endocrine: Negative for polydipsia, polyphagia and polyuria.  Genitourinary:  Negative for dysuria and flank pain.  Musculoskeletal:  Negative for arthralgias, back pain, joint swelling, myalgias and neck stiffness.  Skin:  Negative for color change, rash and wound.  Neurological:  Positive for light-headedness. Negative for dizziness, tremors, seizures, speech difficulty, weakness and headaches.  Psychiatric/Behavioral:  Negative for behavioral problems, confusion,  decreased concentration, dysphoric mood and sleep disturbance. The patient is not nervous/anxious.   All other systems reviewed and are negative.     Objective    BP 127/89 (BP Location: Left Arm) Comment: with patient's bp cuff  Pulse 66   Temp 97.9 F (36.6 C) (Oral)   Ht 5' 8 (1.727 m)   Wt 223 lb 4.8 oz (101.3 kg)   SpO2 100%   BMI 33.95 kg/m    Physical Exam Vitals and nursing note reviewed.  Constitutional:      General: He is awake.     Appearance: Normal appearance.  HENT:     Head: Normocephalic and atraumatic.     Right Ear: Tympanic membrane, ear canal and external ear normal.     Left Ear: Tympanic membrane, ear canal and external ear normal.     Nose: Nose normal.     Mouth/Throat:     Mouth: Mucous membranes are moist.     Pharynx: Oropharynx is clear. No oropharyngeal exudate or posterior oropharyngeal erythema.  Eyes:     General: No scleral icterus.    Extraocular Movements: Extraocular movements intact.     Conjunctiva/sclera: Conjunctivae normal.     Pupils: Pupils are equal, round, and reactive to light.  Neck:     Thyroid: No thyromegaly or thyroid tenderness.  Cardiovascular:     Rate and Rhythm: Normal rate and regular rhythm.     Pulses: Normal pulses.     Heart sounds: Normal heart sounds.  Pulmonary:     Effort: Pulmonary effort is normal. No tachypnea, bradypnea or respiratory distress.     Breath sounds: Normal breath sounds. No stridor. No wheezing, rhonchi or rales.  Abdominal:     General: Bowel sounds are normal. There is no distension.     Palpations: Abdomen is soft. There is no mass.     Tenderness: There is no abdominal tenderness. There is no guarding.     Hernia: No hernia is present.  Musculoskeletal:     Cervical back: Normal range of motion and neck supple.     Right lower leg: No edema.     Left lower leg: No edema.  Lymphadenopathy:     Cervical: No cervical adenopathy.  Skin:    General:  Skin is warm and dry.      Findings: Rash (scaled plaques with pink edges to right knee and right elbow) present.  Neurological:     Mental Status: He is alert and oriented to person, place, and time. Mental status is at baseline.  Psychiatric:        Mood and Affect: Mood normal.        Behavior: Behavior normal.      Last depression screening scores    05/23/2024    8:33 AM 02/13/2024    8:59 AM 12/14/2023    8:04 AM  PHQ 2/9 Scores  PHQ - 2 Score 0 0 0  PHQ- 9 Score 2 0 1   Last fall risk screening    05/23/2024    8:32 AM  Fall Risk   Falls in the past year? 0  Number falls in past yr: 0  Injury with Fall? 0  Risk for fall due to : No Fall Risks  Follow up Falls evaluation completed   Last Audit-C alcohol use screening    05/21/2024    6:35 PM  Alcohol Use Disorder Test (AUDIT)  1. How often do you have a drink containing alcohol? 0  3. How often do you have six or more drinks on one occasion? 0   A score of 3 or more in women, and 4 or more in men indicates increased risk for alcohol abuse, EXCEPT if all of the points are from question 1   No results found for any visits on 05/23/24.  Assessment & Plan    Routine Health Maintenance and Physical Exam  Exercise Activities and Dietary recommendations  Goals   None     Immunization History  Administered Date(s) Administered   Influenza-Unspecified 06/19/2023   Janssen (J&J) SARS-COV-2 Vaccination 03/09/2020   PNEUMOCOCCAL CONJUGATE-20 11/20/2023   Tdap 07/06/2013    Health Maintenance  Topic Date Due   OPHTHALMOLOGY EXAM  Never done   HPV VACCINES (1 - 3-dose SCDM series) Never done   Hepatitis B Vaccines 19-59 Average Risk (1 of 3 - 19+ 3-dose series) 06/02/2024 (Originally 11/28/2002)   COVID-19 Vaccine (2 - 2024-25 season) 06/09/2024 (Originally 06/10/2023)   DTaP/Tdap/Td (2 - Td or Tdap) 11/19/2024 (Originally 07/07/2023)   INFLUENZA VACCINE  01/06/2025 (Originally 05/09/2024)   HEMOGLOBIN A1C  08/20/2024   FOOT EXAM  11/19/2024    Diabetic kidney evaluation - eGFR measurement  02/17/2025   Diabetic kidney evaluation - Urine ACR  02/17/2025   Pneumococcal Vaccine  Completed   Hepatitis C Screening  Completed   HIV Screening  Completed   Meningococcal B Vaccine  Aged Out    Discussed health benefits of physical activity, and encouraged him to engage in regular exercise appropriate for his age and condition.   Annual physical exam  Hypertension associated with type 2 diabetes mellitus (HCC) Assessment & Plan: Type 2 Diabetes Mellitus   Fairly well-controlled based on record brought in today, with glucoses ranging 110s-160s. - Continue Januvia  100 mg daily and Farxiga  10 mg daily. - Continue rosuvastatin  10 mg daily at bedtime - Encourage dietary changes and increased physical activity   - Schedule an eye exam ASAP and annually thereafter     Hypertension   Chronic, with recent fluctuations. Dizziness on full dose led to self-adjustment. Blood pressure normalizing with half tablet. Potential dehydration noted. Adjusted hydrochlorothiazide  due to likely impact by Farxiga  on blood pressure.  - Home blood pressure cuff validated today.  - Continue lisinopril -hydrochlorothiazide ,  with reduced dose of hydrochlorothiazide  (dosage now: 20-12.5 mg). - Monitor blood pressure regularly. - Encourage adequate hydration.   Orders: -     Comprehensive metabolic panel with GFR -     Hemoglobin A1c -     Lisinopril -hydroCHLOROthiazide ; Take 1 tablet by mouth daily.  Dispense: 90 tablet; Refill: 3  Elevated ALT measurement -     Comprehensive metabolic panel with GFR  Hypercalcemia -     Comprehensive metabolic panel with GFR  Microalbuminuric diabetic nephropathy (HCC) -     Microalbumin / creatinine urine ratio  Mixed hyperlipidemia -     Lipid panel  Psoriasis      Annual physical exam Physical exam overall unremarkable except as noted above. Routine lab work ordered as noted. Discussion on vaccinations and  lifestyle modifications. Due for eye exam and flu shot. Discussed HPV and hepatitis B vaccinations. - Schedule and complete an eye exam. - Plan to receive flu shot in late September or early October. - Consider hepatitis B vaccination series if not previously completed.  Psoriasis Psoriasis with occasional itching. Discussed risk of psoriatic arthritis if untreated. - Patient prefers to avoid treatment due to potential for effects on immune system - Monitor for joint pain as a potential sign of psoriatic arthritis.  Allergic rhinitis, intermittent Intermittent symptoms managed by Zyrtec as needed. - Continue using Zyrtec as needed for allergy symptoms.    Return in about 3 months (around 08/23/2024) for Chronic f/u.     I discussed the assessment and treatment plan with the patient  The patient was provided an opportunity to ask questions and all were answered. The patient agreed with the plan and demonstrated an understanding of the instructions.   The patient was advised to call back or seek an in-person evaluation if the symptoms worsen or if the condition fails to improve as anticipated.    LAURAINE LOISE BUOY, DO  [facility] [phone] (phone) [phone] (fax)  [facility]

## 2024-05-23 NOTE — Patient Instructions (Signed)
 Check your vaccination records for the hepatitis B series (3 shots).

## 2024-07-22 ENCOUNTER — Other Ambulatory Visit: Payer: Self-pay

## 2024-07-22 ENCOUNTER — Other Ambulatory Visit (HOSPITAL_COMMUNITY): Payer: Self-pay

## 2024-08-12 ENCOUNTER — Other Ambulatory Visit: Payer: Self-pay

## 2024-08-29 ENCOUNTER — Other Ambulatory Visit: Payer: Self-pay

## 2024-08-29 ENCOUNTER — Ambulatory Visit: Admitting: Family Medicine

## 2024-08-29 VITALS — BP 160/97 | HR 57 | Temp 98.2°F | Ht 68.0 in | Wt 224.4 lb

## 2024-08-29 DIAGNOSIS — L853 Xerosis cutis: Secondary | ICD-10-CM

## 2024-08-29 DIAGNOSIS — E1159 Type 2 diabetes mellitus with other circulatory complications: Secondary | ICD-10-CM

## 2024-08-29 DIAGNOSIS — Z7984 Long term (current) use of oral hypoglycemic drugs: Secondary | ICD-10-CM

## 2024-08-29 DIAGNOSIS — I152 Hypertension secondary to endocrine disorders: Secondary | ICD-10-CM | POA: Diagnosis not present

## 2024-08-29 DIAGNOSIS — E1121 Type 2 diabetes mellitus with diabetic nephropathy: Secondary | ICD-10-CM | POA: Diagnosis not present

## 2024-08-29 DIAGNOSIS — E782 Mixed hyperlipidemia: Secondary | ICD-10-CM | POA: Diagnosis not present

## 2024-08-29 DIAGNOSIS — E1169 Type 2 diabetes mellitus with other specified complication: Secondary | ICD-10-CM

## 2024-08-29 DIAGNOSIS — E785 Hyperlipidemia, unspecified: Secondary | ICD-10-CM | POA: Diagnosis not present

## 2024-08-29 DIAGNOSIS — R7401 Elevation of levels of liver transaminase levels: Secondary | ICD-10-CM | POA: Diagnosis not present

## 2024-08-29 MED ORDER — LISINOPRIL-HYDROCHLOROTHIAZIDE 10-12.5 MG PO TABS
1.0000 | ORAL_TABLET | Freq: Every day | ORAL | 0 refills | Status: DC
  Filled 2024-08-29 (×2): qty 90, 90d supply, fill #0

## 2024-08-29 NOTE — Progress Notes (Signed)
 "     Established patient visit   Patient: Dennis Gibson   DOB: 1986/08/16   38 y.o. Male  MRN: [id number] Visit Date: 08/29/2024  Today's healthcare provider: LAURAINE LOISE BUOY, DO   Chief Complaint  Patient presents with   Medical Management of Chronic Issues    Patient is here for a 3 month follow up for hypertension and diabetes.  States that he did not check on the Hep B vaccines.  HPV Vaccine- declined  Diabetic Eye Exam- not scheduled yet.   Diabetes    Dennis Gibson is a 38 y.o. male who presents for follow up of diabetes.  Reports that he is monitoring his glucose at home and states the highest that was recorded was 167 a couple times.  States he had ate some sweets.  Has been 80-120 somewhere around there as far an average within the last few weeks.   Hypertension    Hypertension Patient is here for follow-up of elevated blood pressure. He is exercising and is adherent to a low-salt diet. Blood pressure has not been monitored lately.   Subjective    Diabetes  Hypertension  Dennis Gibson is a 38 year old male with hypertension and diabetes who presents with dizziness after stopping blood pressure medication.  He experiences dizziness and 'tunnel vision' when bending down and standing back up, which led him to stop his blood pressure medication. He describes feeling 'woozy, almost dizzy' during these episodes. Symptoms worsened with increased physical activity.  He was on a regimen of lisinopril -hydrochlorothiazide  20-12.5 mg.  At the previous visit, he had been taking 1/2 tablet of lisinopril -hydrochlorothiazide  20-25 mg and was increased to 1 full tablet of lisinopril -hydrochlorothiazide  20-12.5 mg.    His blood sugar levels have generally been between 80 to 120 mg/dL, with occasional readings of 167 mg/dL. The lowest blood sugar reading was 80 mg/dL before lunch the previous day.  He has not had an eye exam yet and plans to check his immunization records for the  hepatitis B vaccine. He has not received any vaccines in school and is unsure about his HPV vaccination status.  He reports dry skin on his face, which has been a problem for the last week or two. He has been washing his face more frequently but has not been using lotion. He recalls a similar issue in middle school with dry, flaky skin around his eyelids.  No shortness of breath, chest pain, or leg swelling. He is working on improving his endurance and remains active, although he acknowledges he could be more active. He experiences occasional itching, particularly in the winter, but it does not bother him significantly.       Medications: Outpatient Medications Prior to Visit  Medication Sig   Blood Glucose Monitoring Suppl (BLOOD GLUCOSE MONITOR SYSTEM) w/Device KIT Check blood sugar daily before breakfast.   dapagliflozin  propanediol (FARXIGA ) 10 MG TABS tablet Take 1 tablet (10 mg total) by mouth daily before breakfast.   EPINEPHrine  0.3 mg/0.3 mL IJ SOAJ injection Inject 0.3 mg into the muscle as needed for anaphylaxis.   Lancet Device MISC 1 each by Does not apply route daily before breakfast. May substitute to any manufacturer covered by patient's insurance.   OVER THE COUNTER MEDICATION Multivitamin   rosuvastatin  (CRESTOR ) 20 MG tablet Take 1 tablet (20 mg total) by mouth daily.   sitaGLIPtin  (JANUVIA ) 100 MG tablet Take 1 tablet (100 mg total) by mouth daily.   [DISCONTINUED] Glucose Blood (BLOOD  GLUCOSE TEST STRIPS) STRP Use to test blood sugar daily before breakfast. Use test strips covered by insurance.   [DISCONTINUED] Lancets (FREESTYLE) lancets Use daily before breakfast.   [DISCONTINUED] lisinopril -hydrochlorothiazide  (ZESTORETIC ) 20-12.5 MG tablet Take 1 tablet by mouth daily.   No facility-administered medications prior to visit.        Objective    BP (!) 160/97 (BP Location: Left Arm, Cuff Size: Large)   Pulse (!) 57   Temp 98.2 F (36.8 C) (Oral)   Ht 5' 8  (1.727 m)   Wt 224 lb 6.4 oz (101.8 kg)   SpO2 99%   BMI 34.12 kg/m     Physical Exam Vitals reviewed.  Constitutional:      General: He is not in acute distress.    Appearance: Normal appearance. He is not diaphoretic.  HENT:     Head: Normocephalic and atraumatic.  Eyes:     General: No scleral icterus.    Conjunctiva/sclera: Conjunctivae normal.  Cardiovascular:     Rate and Rhythm: Normal rate and regular rhythm.     Pulses: Normal pulses.     Heart sounds: Normal heart sounds. No murmur heard. Pulmonary:     Effort: Pulmonary effort is normal. No respiratory distress.     Breath sounds: Normal breath sounds. No wheezing or rhonchi.  Musculoskeletal:     Cervical back: Neck supple.     Left lower leg: No edema.  Lymphadenopathy:     Cervical: No cervical adenopathy.  Skin:    General: Skin is warm and dry.     Findings: Acne (within past 1-2 weeks.) present. No bruising or rash.  Neurological:     Mental Status: He is alert and oriented to person, place, and time. Mental status is at baseline.  Psychiatric:        Mood and Affect: Mood normal.        Behavior: Behavior normal.      No results found for any visits on 08/29/24.  Assessment & Plan    Hypertension associated with type 2 diabetes mellitus (HCC) -     Lisinopril -hydroCHLOROthiazide ; Take 1 tablet by mouth daily.  Dispense: 90 tablet; Refill: 0  Hyperlipidemia associated with type 2 diabetes mellitus (HCC)  Dry skin    Hypertension associated with type 2 diabetes mellitus Dizziness and tunnel vision likely due to medication dosage. Symptoms worsened with activity, leading to discontinuation and subsequent elevated blood pressures. - Restarted lisinopril -hydrochlorothiazide  at a dose of 10-12.5 mg daily. - Monitor blood pressure at home. - Follow up in 3 months to reassess blood pressure and medication efficacy.  Hyperlipidemia associated with type 2 diabetes mellitus Chronic, stable.  Blood  sugar levels well-controlled, recent readings 80-120 mg/dL.  Currently managed with sitagliptin  100 mg, dapagliflozin  10 mg and rosuvastatin  20 mg. - Check A1c level. - Continue monitoring home blood sugar levels.  Dry skin Dry skin around eyes, no current lotion use. - Apply lotion to the face. - Consider very minimal applications of hydrocortisone for itching or irritation.    Return in about 3 months (around 11/29/2024) for HTN, DM.      I discussed the assessment and treatment plan with the patient  The patient was provided an opportunity to ask questions and all were answered. The patient agreed with the plan and demonstrated an understanding of the instructions.   The patient was advised to call back or seek an in-person evaluation if the symptoms worsen or if the condition fails to improve  as anticipated.    LAURAINE LOISE BUOY, DO  [facility] [phone] (phone) [phone] (fax)  [facility] "

## 2024-08-29 NOTE — Patient Instructions (Signed)
 Schedule your eye exam!  Replace the battery in your blood pressure cuff and check your blood pressures at home.

## 2024-08-30 LAB — HEMOGLOBIN A1C
Est. average glucose Bld gHb Est-mCnc: 186 mg/dL
Hgb A1c MFr Bld: 8.1 % — ABNORMAL HIGH (ref 4.8–5.6)

## 2024-08-30 LAB — COMPREHENSIVE METABOLIC PANEL WITH GFR
ALT: 52 IU/L — ABNORMAL HIGH (ref 0–44)
AST: 37 IU/L (ref 0–40)
Albumin: 5 g/dL (ref 4.1–5.1)
Alkaline Phosphatase: 108 IU/L (ref 47–123)
BUN/Creatinine Ratio: 11 (ref 9–20)
BUN: 13 mg/dL (ref 6–24)
Bilirubin Total: 0.5 mg/dL (ref 0.0–1.2)
CO2: 26 mmol/L (ref 20–29)
Calcium: 10.8 mg/dL — ABNORMAL HIGH (ref 8.7–10.2)
Chloride: 101 mmol/L (ref 96–106)
Creatinine, Ser: 1.18 mg/dL (ref 0.76–1.27)
Globulin, Total: 2.7 g/dL (ref 1.5–4.5)
Glucose: 110 mg/dL — ABNORMAL HIGH (ref 70–99)
Potassium: 4.7 mmol/L (ref 3.5–5.2)
Sodium: 141 mmol/L (ref 134–144)
Total Protein: 7.7 g/dL (ref 6.0–8.5)
eGFR: 80 mL/min/1.73 (ref >59)

## 2024-08-30 LAB — LIPID PANEL
Chol/HDL Ratio: 2.8 ratio (ref 0.0–5.0)
Cholesterol, Total: 176 mg/dL (ref 100–199)
HDL: 63 mg/dL (ref >39)
LDL Chol Calc (NIH): 96 mg/dL (ref 0–99)
Triglycerides: 94 mg/dL (ref 0–149)
VLDL Cholesterol Cal: 17 mg/dL (ref 5–40)

## 2024-08-30 LAB — MICROALBUMIN / CREATININE URINE RATIO
Creatinine, Urine: 62 mg/dL
Microalb/Creat Ratio: 196 mg/g{creat} — ABNORMAL HIGH (ref 0–29)
Microalbumin, Urine: 121.3 ug/mL

## 2024-09-03 ENCOUNTER — Other Ambulatory Visit: Payer: Self-pay

## 2024-09-03 ENCOUNTER — Other Ambulatory Visit: Payer: Self-pay | Admitting: Family Medicine

## 2024-09-03 ENCOUNTER — Other Ambulatory Visit (HOSPITAL_COMMUNITY): Payer: Self-pay

## 2024-09-03 DIAGNOSIS — E1159 Type 2 diabetes mellitus with other circulatory complications: Secondary | ICD-10-CM

## 2024-09-03 MED ORDER — FREESTYLE LANCETS MISC
1.0000 | Freq: Every day | 3 refills | Status: AC
  Filled 2024-09-03 – 2024-10-03 (×2): qty 100, 100d supply, fill #0

## 2024-09-05 ENCOUNTER — Other Ambulatory Visit: Payer: Self-pay

## 2024-09-05 ENCOUNTER — Other Ambulatory Visit (HOSPITAL_COMMUNITY): Payer: Self-pay

## 2024-09-15 ENCOUNTER — Ambulatory Visit: Payer: Self-pay | Admitting: Family Medicine

## 2024-09-19 ENCOUNTER — Encounter: Payer: Self-pay | Admitting: Family Medicine

## 2024-09-19 DIAGNOSIS — E1159 Type 2 diabetes mellitus with other circulatory complications: Secondary | ICD-10-CM

## 2024-09-24 ENCOUNTER — Other Ambulatory Visit: Payer: Self-pay

## 2024-09-24 ENCOUNTER — Other Ambulatory Visit: Payer: Self-pay | Admitting: Family Medicine

## 2024-09-24 DIAGNOSIS — Z91030 Bee allergy status: Secondary | ICD-10-CM

## 2024-09-24 DIAGNOSIS — I152 Hypertension secondary to endocrine disorders: Secondary | ICD-10-CM

## 2024-09-24 MED ORDER — BLOOD GLUCOSE TEST VI STRP
1.0000 | ORAL_STRIP | Freq: Every day | 3 refills | Status: AC
  Filled 2024-09-24: qty 100, 100d supply, fill #0
  Filled 2024-10-03: qty 100, 90d supply, fill #0

## 2024-09-25 ENCOUNTER — Other Ambulatory Visit: Payer: Self-pay | Admitting: Family Medicine

## 2024-09-25 ENCOUNTER — Other Ambulatory Visit: Payer: Self-pay

## 2024-09-25 DIAGNOSIS — Z91030 Bee allergy status: Secondary | ICD-10-CM

## 2024-09-26 ENCOUNTER — Other Ambulatory Visit: Payer: Self-pay

## 2024-09-29 ENCOUNTER — Other Ambulatory Visit: Payer: Self-pay

## 2024-09-29 MED FILL — Epinephrine Solution Auto-injector 0.3 MG/0.3ML (1:1000): INTRAMUSCULAR | 2 days supply | Qty: 2 | Fill #0 | Status: AC

## 2024-10-03 ENCOUNTER — Other Ambulatory Visit: Payer: Self-pay

## 2024-10-03 MED ORDER — LOSARTAN POTASSIUM 25 MG PO TABS
25.0000 mg | ORAL_TABLET | Freq: Every day | ORAL | 3 refills | Status: AC
  Filled 2024-10-03: qty 30, 30d supply, fill #0
  Filled 2024-10-29: qty 90, 90d supply, fill #1

## 2024-10-03 MED ORDER — HYDROCHLOROTHIAZIDE 12.5 MG PO CAPS
12.5000 mg | ORAL_CAPSULE | Freq: Every day | ORAL | 3 refills | Status: AC
  Filled 2024-10-03: qty 30, 30d supply, fill #0
  Filled 2024-10-29: qty 90, 90d supply, fill #1

## 2024-10-06 ENCOUNTER — Other Ambulatory Visit: Payer: Self-pay

## 2024-11-14 ENCOUNTER — Other Ambulatory Visit (HOSPITAL_COMMUNITY): Payer: Self-pay

## 2024-12-01 ENCOUNTER — Ambulatory Visit: Admitting: Family Medicine
